# Patient Record
Sex: Female | Born: 1970 | Race: Black or African American | Hispanic: No | Marital: Single | Smoking: Never smoker
Health system: Southern US, Community
[De-identification: ages and names within clinical notes are randomized; demographics above are authoritative.]

## PROBLEM LIST (undated history)

## (undated) DIAGNOSIS — N939 Abnormal uterine and vaginal bleeding, unspecified: Secondary | ICD-10-CM

## (undated) DIAGNOSIS — M1990 Unspecified osteoarthritis, unspecified site: Secondary | ICD-10-CM

## (undated) HISTORY — DX: Abnormal uterine and vaginal bleeding, unspecified: N93.9

---

## 1998-12-19 ENCOUNTER — Other Ambulatory Visit: Admission: RE | Admit: 1998-12-19 | Discharge: 1998-12-19 | Payer: Self-pay | Admitting: *Deleted

## 1999-01-23 ENCOUNTER — Other Ambulatory Visit: Admission: RE | Admit: 1999-01-23 | Discharge: 1999-01-23 | Payer: Self-pay | Admitting: Obstetrics and Gynecology

## 1999-01-23 ENCOUNTER — Encounter (INDEPENDENT_AMBULATORY_CARE_PROVIDER_SITE_OTHER): Payer: Self-pay | Admitting: Specialist

## 1999-06-12 ENCOUNTER — Other Ambulatory Visit: Admission: RE | Admit: 1999-06-12 | Discharge: 1999-06-12 | Payer: Self-pay | Admitting: Obstetrics and Gynecology

## 2000-03-01 ENCOUNTER — Other Ambulatory Visit: Admission: RE | Admit: 2000-03-01 | Discharge: 2000-03-01 | Payer: Self-pay | Admitting: *Deleted

## 2000-05-03 ENCOUNTER — Ambulatory Visit (HOSPITAL_COMMUNITY): Admission: RE | Admit: 2000-05-03 | Discharge: 2000-05-03 | Payer: Self-pay

## 2001-03-05 ENCOUNTER — Other Ambulatory Visit: Admission: RE | Admit: 2001-03-05 | Discharge: 2001-03-05 | Payer: Self-pay | Admitting: Obstetrics and Gynecology

## 2004-02-08 ENCOUNTER — Other Ambulatory Visit: Admission: RE | Admit: 2004-02-08 | Discharge: 2004-02-08 | Payer: Self-pay | Admitting: Obstetrics and Gynecology

## 2005-05-25 ENCOUNTER — Other Ambulatory Visit: Admission: RE | Admit: 2005-05-25 | Discharge: 2005-05-25 | Payer: Self-pay | Admitting: Obstetrics and Gynecology

## 2006-07-09 ENCOUNTER — Other Ambulatory Visit: Admission: RE | Admit: 2006-07-09 | Discharge: 2006-07-09 | Payer: Self-pay | Admitting: Obstetrics & Gynecology

## 2008-02-20 ENCOUNTER — Other Ambulatory Visit: Admission: RE | Admit: 2008-02-20 | Discharge: 2008-02-20 | Payer: Self-pay | Admitting: Obstetrics and Gynecology

## 2008-06-15 ENCOUNTER — Encounter: Admission: RE | Admit: 2008-06-15 | Discharge: 2008-06-15 | Payer: Self-pay | Admitting: Endocrinology

## 2008-08-23 ENCOUNTER — Ambulatory Visit (HOSPITAL_COMMUNITY): Admission: RE | Admit: 2008-08-23 | Discharge: 2008-08-23 | Payer: Self-pay | Admitting: Obstetrics & Gynecology

## 2008-08-23 ENCOUNTER — Encounter: Payer: Self-pay | Admitting: Obstetrics & Gynecology

## 2008-08-23 HISTORY — PX: DILATION AND CURETTAGE OF UTERUS: SHX78

## 2009-08-05 ENCOUNTER — Encounter: Admission: RE | Admit: 2009-08-05 | Discharge: 2009-08-05 | Payer: Self-pay | Admitting: Internal Medicine

## 2010-05-08 ENCOUNTER — Other Ambulatory Visit: Admission: RE | Admit: 2010-05-08 | Discharge: 2010-05-08 | Payer: Self-pay | Admitting: Obstetrics and Gynecology

## 2010-11-13 LAB — URINALYSIS, ROUTINE W REFLEX MICROSCOPIC
Glucose, UA: NEGATIVE mg/dL
Ketones, ur: NEGATIVE mg/dL
Leukocytes, UA: NEGATIVE
Protein, ur: NEGATIVE mg/dL
Urobilinogen, UA: 1 mg/dL (ref 0.0–1.0)
pH: 6 (ref 5.0–8.0)

## 2010-11-13 LAB — URINE MICROSCOPIC-ADD ON

## 2010-11-13 LAB — CBC
Hemoglobin: 11.1 g/dL — ABNORMAL LOW (ref 12.0–15.0)
MCHC: 30.9 g/dL (ref 30.0–36.0)
WBC: 13.8 10*3/uL — ABNORMAL HIGH (ref 4.0–10.5)

## 2010-12-12 NOTE — Op Note (Signed)
NAME:  Aimee Moore, Aimee Moore            ACCOUNT NO.:  [id number]   MEDICAL RECORD NO.:  [id number]          PATIENT TYPE:  AMB   LOCATION:  SDC                           FACILITY:  [facility]   PHYSICIAN:  M. Leda Quail, MD  DATE OF BIRTH:  12-16-1970   DATE OF PROCEDURE:  DATE OF DISCHARGE:                               OPERATIVE REPORT   PREOPERATIVE DIAGNOSES:  62. A 40 year old, G0, morbidly obese African American female with      dysfunctional uterine bleeding.  2. Endometrial biopsy in the office showed slight cytologic atypia,      but not enough tissue for diagnosis.   POSTOPERATIVE DIAGNOSES:  17. A 40 year old, G0, morbidly obese African American female with      dysfunctional uterine bleeding.  2. Endometrial biopsy in the office showed slight cytologic atypia,      but not enough tissue for diagnosis.   PROCEDURE:  D & C.   SURGEON:  M. Leda Quail, MD   ASSISTANT:  OR staff.   ANESTHESIA:  LMA, Dr. Malen Gauze oversaw the case.   FINDINGS:  Relatively small amount of endometrial tissue noted with  curetting.   SPECIMENS:  Endometrial curettings sent to pathology.   ESTIMATED BLOOD LOSS:  Minimal.   FLUIDS:  500 mL of LR.   URINE OUTPUT:  50 mL drained with an in-and-out catheterization at the  beginning of the procedure.   COMPLICATIONS:  None.   INDICATIONS:  Aimee Moore is a 40 year old African American female who  has a history of dysfunctional uterine bleeding which has been managed  initially this year with progestin therapy.  She is not sexually active  and is not at risk for pregnancy.  This did not control her bleeding and  she came in for an ultrasound which showed endometrium of about 11 mm.  Because of her obesity, I decided to do an office biopsy at the same  time.  She was bleeding on the day of her biopsy.  The biopsy results  showed some questionable cytologic atypia.  The pathologist who read it  personally called and suggested a repeat biopsy.   Because of her  obesity, I thought a D and C might be not only diagnostic but  therapeutic, and therefore I recommended proceeding with a D and C.  Risks and benefits were explained to the patient.  She presented for the  procedure today.   The patient was taken to the operating room.  She was placed in supine  position.  LMA anesthesia was administered by Anesthesia staff without  difficulty.  Legs are positioned in the [place] stirrups in the high  lithotomy position.  Perineum, inner thighs, and vagina were prepped in  a normal sterile fashion.  A straight red rubber catheter was used to  drain the bladder of all urine.  The patient was draped in normal  sterile fashion.   Attention was turned toward the vagina.  A long Peterson speculum was  placed in the vagina.  The anterior lip of the cervix was grasped with a  single-tooth tenaculum.  A paracervical block of 1%  lidocaine mixed 1:1  with epinephrine (1:100,000 units) was instilled in the cervix.  Total  10 mL were instilled.  The uterus sounded to 8 cm.  The Pratt dilators  were used to dilate the cervix up to #22.  Another #1 smooth curette was  then used to curette the endometrial cavity until a rough gritty texture  was noted in all quadrants.  A #1 rough curette was used to follow the  smooth curette and again a rough gritty texture was noted in all  quadrants.  There was not a tremendous amount of tissue that was  obtained and actually small amount than I was expecting.  At this point,  the specimen was handed off.  The tenaculum was removed from the cervix.  There was no bleeding from tenaculum sites.  Speculum was removed from  the vagina.  The Betadine prep was cleansed off the patient's skin.  Her  legs were positioned back in the supine position.  She was awakened from  anesthesia.  Sponge, lap, needle, and instrument counts were correct x2.  Once the patient was awake, she was taken to the recovery room in stable   condition.      Aimee Keas, MD  Electronically Signed     MSM/MEDQ  D:  08/23/2008  T:  08/23/2008  Job:  [id number]

## 2011-10-17 ENCOUNTER — Emergency Department (HOSPITAL_COMMUNITY): Payer: 59

## 2011-10-17 ENCOUNTER — Other Ambulatory Visit: Payer: Self-pay

## 2011-10-17 ENCOUNTER — Encounter (HOSPITAL_COMMUNITY): Payer: Self-pay | Admitting: *Deleted

## 2011-10-17 ENCOUNTER — Observation Stay (HOSPITAL_COMMUNITY)
Admission: EM | Admit: 2011-10-17 | Discharge: 2011-10-19 | Disposition: A | Discharge status: Home / Self Care | Payer: 59 | Source: Ambulatory Visit | Attending: Internal Medicine | Admitting: Internal Medicine

## 2011-10-17 DIAGNOSIS — R410 Disorientation, unspecified: Secondary | ICD-10-CM

## 2011-10-17 DIAGNOSIS — Z79899 Other long term (current) drug therapy: Secondary | ICD-10-CM | POA: Insufficient documentation

## 2011-10-17 DIAGNOSIS — R1110 Vomiting, unspecified: Secondary | ICD-10-CM | POA: Insufficient documentation

## 2011-10-17 DIAGNOSIS — E118 Type 2 diabetes mellitus with unspecified complications: Secondary | ICD-10-CM

## 2011-10-17 DIAGNOSIS — E119 Type 2 diabetes mellitus without complications: Secondary | ICD-10-CM | POA: Insufficient documentation

## 2011-10-17 DIAGNOSIS — R4182 Altered mental status, unspecified: Principal | ICD-10-CM | POA: Insufficient documentation

## 2011-10-17 DIAGNOSIS — E669 Obesity, unspecified: Secondary | ICD-10-CM

## 2011-10-17 DIAGNOSIS — N390 Urinary tract infection, site not specified: Secondary | ICD-10-CM | POA: Insufficient documentation

## 2011-10-17 DIAGNOSIS — Z6841 Body Mass Index (BMI) 40.0 and over, adult: Secondary | ICD-10-CM | POA: Insufficient documentation

## 2011-10-17 LAB — BASIC METABOLIC PANEL
Chloride: 101 mEq/L (ref 96–112)
Creatinine, Ser: 0.61 mg/dL (ref 0.50–1.10)
GFR calc Af Amer: >90 mL/min (ref >90)
Potassium: 4.1 mEq/L (ref 3.5–5.1)
Sodium: 137 mEq/L (ref 135–145)

## 2011-10-17 LAB — URINALYSIS, ROUTINE W REFLEX MICROSCOPIC
Glucose, UA: NEGATIVE mg/dL
Protein, ur: NEGATIVE mg/dL
pH: 5.5 (ref 5.0–8.0)

## 2011-10-17 LAB — CBC
Hemoglobin: 14.3 g/dL (ref 12.0–15.0)
MCHC: 33.7 g/dL (ref 30.0–36.0)
Platelets: 396 10*3/uL (ref 150–400)
RBC: 4.79 MIL/uL (ref 3.87–5.11)

## 2011-10-17 LAB — CARDIAC PANEL(CRET KIN+CKTOT+MB+TROPI)
CK, MB: 0.8 ng/mL (ref 0.3–4.0)
Relative Index: INVALID (ref 0.0–2.5)
Total CK: 42 U/L (ref 7–177)
Troponin I: <0.3 ng/mL (ref <0.30)

## 2011-10-17 LAB — RAPID URINE DRUG SCREEN, HOSP PERFORMED
Barbiturates: NOT DETECTED
Benzodiazepines: NOT DETECTED
Opiates: NOT DETECTED
Tetrahydrocannabinol: NOT DETECTED

## 2011-10-17 LAB — SALICYLATE LEVEL: Salicylate Lvl: <2 mg/dL — ABNORMAL LOW (ref 2.8–20.0)

## 2011-10-17 LAB — AMMONIA: Ammonia: 23 umol/L (ref 11–60)

## 2011-10-17 LAB — LACTIC ACID, PLASMA: Lactic Acid, Venous: 1 mmol/L (ref 0.5–2.2)

## 2011-10-17 LAB — URINE MICROSCOPIC-ADD ON

## 2011-10-17 MED ORDER — SODIUM CHLORIDE 0.9 % IV BOLUS (SEPSIS): 500.0000 mL | INTRAVENOUS | Status: DC

## 2011-10-17 MED ORDER — ZIPRASIDONE MESYLATE 20 MG IM SOLR
20.0000 mg | Freq: Once | INTRAMUSCULAR | Status: AC
  Administered 2011-10-17: 20 mg via INTRAMUSCULAR

## 2011-10-17 MED ORDER — SODIUM CHLORIDE 0.9 % IV SOLN: 250.0000 mL | INTRAVENOUS | Status: DC | PRN

## 2011-10-17 MED ORDER — SODIUM CHLORIDE 0.9 % IJ SOLN: 3.0000 mL | INTRAMUSCULAR | Status: DC | PRN

## 2011-10-17 MED ORDER — SODIUM CHLORIDE 0.9 % IJ SOLN
3.0000 mL | Freq: Two times a day (BID) | INTRAMUSCULAR | Status: DC
  Administered 2011-10-18 – 2011-10-19 (×2): 3 mL via INTRAVENOUS

## 2011-10-17 MED ORDER — ZIPRASIDONE MESYLATE 20 MG IM SOLR
INTRAMUSCULAR | Status: AC
  Filled 2011-10-17: qty 20

## 2011-10-17 NOTE — ED Notes (Signed)
Attempted to call report to [phone], RN busy and will call me back

## 2011-10-17 NOTE — ED Notes (Signed)
SW-transport tech- is taking pt to CT.

## 2011-10-17 NOTE — Consult Note (Signed)
[facility] CONSULT NOTE     Reason for Consult: AMS    HPI:    Aimee Moore is an 41 y.o. female who was last seen normal yesterday at about 4 AM.  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  Walked into the apartment and sat down but was not very verbal.  Answred yes and no to questions.  She went to lay down but came back to living room 10 minutes later.  At this time she was just staring off and would not answer questions.  Next AM her boyfriend noted she was not getting ready for work and remained non verbal.  It was at this time EMS was called.  Currently she is in stretcher, looking around quickly, will follow selective verbal commands.  She is moving all extremities purposefully and spontaneously.   Past Medical History  Diagnosis Date  . Diabetes mellitus     History reviewed. No pertinent past surgical history.  History reviewed. No pertinent family history.  Social History:  reports that she has never smoked. She does not have any smokeless tobacco history on file. She reports that she does not drink alcohol or use illicit drugs.  No Known Allergies  Medications:    Prior to Admission:  Metformin   Scheduled:   . sodium chloride  500 mL Intravenous STAT    Review of Systems - unattainable  Blood pressure 138/89, pulse 107, temperature 97.6 F (36.4 C), temperature source Oral, resp. rate 15, SpO2 95.00%.   Neurologic Examination:   Mental Status: Alert,  looking around quickly, will follow selective verbal commands. Cranial Nerves: II-Visual fields grossly intact. III/IV/VI-Extraocular movements intact.  Pupils reactive bilaterally. V/VII-Smile symmetric VIII-grossly intact IX/X-normal gag XI-bilateral shoulder shrug XII-midline tongue extension Motor: Withdrawals briskly all four extremities antigravity Sensory: Pinprick and light touch intact  throughout, bilaterally Deep Tendon Reflexes: 1 and symmetric throughout Plantars: Downgoing bilaterally Cerebellar: no dysmetria noted on UE movements  No results found for this basename: cbc, bmp, coags, chol, tri, ldl, hga1c    Results for orders placed during the hospital encounter of 10/17/11 (from the past 48 hour(s))  CBC     Status: Normal   Collection Time   10/17/11  1:06 PM      Component Value Range Comment   WBC 10.5  4.0 - 10.5 (K/uL)    RBC 4.79  3.87 - 5.11 (MIL/uL)    Hemoglobin 14.3  12.0 - 15.0 (g/dL)    HCT 95.2  84.1 - 32.4 (%)    MCV 88.5  78.0 - 100.0 (fL)    MCH 29.9  26.0 - 34.0 (pg)    MCHC 33.7  30.0 - 36.0 (g/dL)    RDW 40.1  02.7 - 25.3 (%)    Platelets 396  150 - 400 (K/uL)   BASIC METABOLIC PANEL     Status: Abnormal   Collection Time   10/17/11  1:06 PM      Component Value Range Comment   Sodium 137  135 - 145 (mEq/L)    Potassium 4.1  3.5 - 5.1 (mEq/L)    Chloride 101  96 - 112 (mEq/L)    CO2 24  19 - 32 (mEq/L)    Glucose, Bld 146 (*) 70 - 99 (mg/dL)    BUN 8  6 -  23 (mg/dL)    Creatinine, Ser 1.61  0.50 - 1.10 (mg/dL)    Calcium 9.5  8.4 - 10.5 (mg/dL)    GFR calc non Af Amer >90  >90 (mL/min)    GFR calc Af Amer >90  >90 (mL/min)   CARDIAC PANEL(CRET KIN+CKTOT+MB+TROPI)     Status: Normal   Collection Time   10/17/11  1:06 PM      Component Value Range Comment   Total CK 42  7 - 177 (U/L)    CK, MB 0.8  0.3 - 4.0 (ng/mL)    Troponin I <0.30  <0.30 (ng/mL)    Relative Index RELATIVE INDEX IS INVALID  0.0 - 2.5    ACETAMINOPHEN LEVEL     Status: Normal   Collection Time   10/17/11  1:41 PM      Component Value Range Comment   Acetaminophen (Tylenol), Serum <15.0  10 - 30 (ug/mL)   SALICYLATE LEVEL     Status: Abnormal   Collection Time   10/17/11  1:41 PM      Component Value Range Comment   Salicylate Lvl <2.0 (*) 2.8 - 20.0 (mg/dL)     Dg Chest 2 View  0/96/0454  *RADIOLOGY REPORT*  Clinical Data: Shortness of breath,  confusion, history diabetes  CHEST - 2 VIEW  Comparison: 08/05/2009  Findings: Lateral view nondiagnostic due to body habitus and light technique. Enlargement of cardiac silhouette. Pulmonary vascular congestion. Minimally tortuous thoracic aorta. Decreased lung volumes with minimal atelectasis at mid-to-lower right lung. No gross infiltrate or effusion.  IMPRESSION: Enlargement of cardiac silhouette with pulmonary vascular congestion. Decreased lung volumes with minimal atelectasis right lung as above.  Original Report Authenticated By: Lollie Marrow, M.D.   Ct Head Wo Contrast  10/17/2011  *RADIOLOGY REPORT*  Clinical Data: Weakness and speech disturbance.  CT HEAD WITHOUT CONTRAST  Technique:  Contiguous axial images were obtained from the base of the skull through the vertex without contrast.  Comparison: None.  Findings: The examination suffers from some motion degradation. The brain does not show atrophy.  There is no evidence of old or acute infarction, mass lesion, hemorrhage, hydrocephalus or extra- axial collection.  Calvarium is unremarkable.  Sinuses, middle ears and mastoids are clear.  The sella appears enlarged, possibly due to arachnoid herniation.  No evidence of sellar or suprasellar mass.  IMPRESSION: Negative head CT.  Original Report Authenticated By: Thomasenia Sales, M.D.     Assessment/Plan:    41 YO female with AMS for 24 hours.  Afebrile  CBC/BMET WNL and CT head negative.  No known history of drug abuse.  No concerning prescribed medications.  Etiology of AMS unknown at this time.    Recommend: 1) EEG 2) MRI brain 3) UDS  Felicie Morn PA-C [facility] [phone]  10/17/2011, 3:11 PM   Patient seen and examined. I agree with the above.  Thana Farr, MD [facility] [phone]  10/17/2011  4:51 PM

## 2011-10-17 NOTE — ED Notes (Signed)
CBG CHECKED BY EMT R [id number]

## 2011-10-17 NOTE — ED Notes (Signed)
5530-01 Ready 

## 2011-10-17 NOTE — ED Notes (Signed)
EKG to be done once pt. Is pulled into a room. In addition, pt unable to provide urine at this time due to confusion.

## 2011-10-17 NOTE — ED Notes (Signed)
5501-02 Ready 

## 2011-10-17 NOTE — H&P (Signed)
Aimee Moore,Aimee Moore Female, 41 y.o., 1971/06/24 10/17/11  PCP:  Andi Devon  Chief Complaint:  Patient is non verbal  HPI: 41 y/o AAF that presented to the hospital after being found non verbal by her boyfriend.  He has lived with her for years.  Does not report any history of psychiatric disease.  In the ED work up was negative.  Neuro was called and they requested further work-up with MRI, UDS, and EEG to rule out any non psychiatric cause.  Orders have been placed and we were asked to admit patient.  Patient looked in my direction and answered (with a nod) yes and no to only a few questions.  Otherwise did not interact with examiner well.  Allergies:  No Known Allergies    Past Medical History  Diagnosis Date  . Diabetes mellitus     History reviewed. No pertinent past surgical history.  Prior to Admission medications   Medication Sig Start Date End Date Taking? Authorizing Provider  norethindrone (AYGESTIN) 5 MG tablet Take 5 mg by mouth daily.   Yes Historical Provider, MD  Saxagliptin-Metformin 2.11-998 MG TB24 Take 1 tablet by mouth daily.   Yes Historical Provider, MD  Vitamin D, Ergocalciferol, (DRISDOL) 50000 UNITS CAPS Take 50,000 Units by mouth 2 (two) times a week. Takes on Mondays & Thursdays   Yes Historical Provider, MD    Social History:  reports that she has never smoked. She does not have any smokeless tobacco history on file. She reports that she does not drink alcohol or use illicit drugs.  History reviewed. No pertinent family history.  Review of Systems:  Unable to obtain due to patient current condition.   Physical Exam: Blood pressure 138/89, pulse 107, temperature 97.6 F (36.4 C), temperature source Oral, resp. rate 15, SpO2 95.00%. General: in no acute distress, gazes towards examiner. HEENT: EOMI, no goiter. Heart: Regular rate and rhythm, without murmurs, rubs, gallops. Lungs: Clear to auscultation bilaterally. Abdomen: Soft, nontender,  nondistended, positive bowel sounds, large panus Extremities: No clubbing cyanosis or edema with positive pedal pulses. Neuro: limited due to patient lack of interaction with examiner.  Moves all extremities and gazes towards examiner.  With a nod answered some questions (Are you in the hospital)    Labs on Admission:  Results for orders placed during the hospital encounter of 10/17/11 (from the past 48 hour(s))  CBC     Status: Normal   Collection Time   10/17/11  1:06 PM      Component Value Range Comment   WBC 10.5  4.0 - 10.5 (K/uL)    RBC 4.79  3.87 - 5.11 (MIL/uL)    Hemoglobin 14.3  12.0 - 15.0 (g/dL)    HCT 16.1  09.6 - 04.5 (%)    MCV 88.5  78.0 - 100.0 (fL)    MCH 29.9  26.0 - 34.0 (pg)    MCHC 33.7  30.0 - 36.0 (g/dL)    RDW 40.9  81.1 - 91.4 (%)    Platelets 396  150 - 400 (K/uL)   BASIC METABOLIC PANEL     Status: Abnormal   Collection Time   10/17/11  1:06 PM      Component Value Range Comment   Sodium 137  135 - 145 (mEq/L)    Potassium 4.1  3.5 - 5.1 (mEq/L)    Chloride 101  96 - 112 (mEq/L)    CO2 24  19 - 32 (mEq/L)    Glucose, Bld 146 (*) 70 -  99 (mg/dL)    BUN 8  6 - 23 (mg/dL)    Creatinine, Ser 9.60  0.50 - 1.10 (mg/dL)    Calcium 9.5  8.4 - 10.5 (mg/dL)    GFR calc non Af Amer >90  >90 (mL/min)    GFR calc Af Amer >90  >90 (mL/min)   CARDIAC PANEL(CRET KIN+CKTOT+MB+TROPI)     Status: Normal   Collection Time   10/17/11  1:06 PM      Component Value Range Comment   Total CK 42  7 - 177 (U/L)    CK, MB 0.8  0.3 - 4.0 (ng/mL)    Troponin I <0.30  <0.30 (ng/mL)    Relative Index RELATIVE INDEX IS INVALID  0.0 - 2.5    ACETAMINOPHEN LEVEL     Status: Normal   Collection Time   10/17/11  1:41 PM      Component Value Range Comment   Acetaminophen (Tylenol), Serum <15.0  10 - 30 (ug/mL)   SALICYLATE LEVEL     Status: Abnormal   Collection Time   10/17/11  1:41 PM      Component Value Range Comment   Salicylate Lvl <2.0 (*) 2.8 - 20.0 (mg/dL)   GLUCOSE,  CAPILLARY     Status: Abnormal   Collection Time   10/17/11  3:39 PM      Component Value Range Comment   Glucose-Capillary 128 (*) 70 - 99 (mg/dL)     Radiological Exams on Admission: Dg Chest 2 View  10/17/2011  *RADIOLOGY REPORT*  Clinical Data: Shortness of breath, confusion, history diabetes  CHEST - 2 VIEW  Comparison: 08/05/2009  Findings: Lateral view nondiagnostic due to body habitus and light technique. Enlargement of cardiac silhouette. Pulmonary vascular congestion. Minimally tortuous thoracic aorta. Decreased lung volumes with minimal atelectasis at mid-to-lower right lung. No gross infiltrate or effusion.  IMPRESSION: Enlargement of cardiac silhouette with pulmonary vascular congestion. Decreased lung volumes with minimal atelectasis right lung as above.  Original Report Authenticated By: Lollie Marrow, M.D.   Ct Head Wo Contrast  10/17/2011  *RADIOLOGY REPORT*  Clinical Data: Weakness and speech disturbance.  CT HEAD WITHOUT CONTRAST  Technique:  Contiguous axial images were obtained from the base of the skull through the vertex without contrast.  Comparison: None.  Findings: The examination suffers from some motion degradation. The brain does not show atrophy.  There is no evidence of old or acute infarction, mass lesion, hemorrhage, hydrocephalus or extra- axial collection.  Calvarium is unremarkable.  Sinuses, middle ears and mastoids are clear.  The sella appears enlarged, possibly due to arachnoid herniation.  No evidence of sellar or suprasellar mass.  IMPRESSION: Negative head CT.  Original Report Authenticated By: Thomasenia Sales, M.D.    Assessment/Plan 1) Altered mental status: Question is whether this is pscychiatric or not.  Based on neurology's note we will follow up with their recommendations.  This may be a conversion disorder of which patient may benefit from a psychiatric evaluation.  2) DM: Place on diabetic diet, check cbg's, and place on SSI.  Time Spent on  Admission: > 40 minutes.  Penny Pia [facility] Pager: [phone] 10/17/2011, 4:57 PM

## 2011-10-17 NOTE — ED Provider Notes (Addendum)
History     CSN: [id number]  Arrival date & time 10/17/11  1229   First MD Initiated Contact with Patient 10/17/11 1314      Chief Complaint  Patient presents with  . Weakness  41yo F history of DM, nonverbal at this time.  Most history obtained from pt's friend/roommate.    Was in normal state of health up until 2 weeks ago.  Vomited.   Dr Kirtland Bouchard. [address]   Lightheaded.  Saw her PMD, not sure what was done.  Went to work yesterday, was less Interactive last nights.    No further history is obtained.  Pt is non verbal and uncooperative. .   (Consider location/radiation/quality/duration/timing/severity/associated sxs/prior treatment) HPI  Past Medical History  Diagnosis Date  . Diabetes mellitus     History reviewed. No pertinent past surgical history.  History reviewed. No pertinent family history.  History  Substance Use Topics  . Smoking status: Never Smoker   . Smokeless tobacco: Not on file  . Alcohol Use: No    OB History    Grav Para Term Preterm Abortions TAB SAB Ect Mult Living                  Review of Systems  All other systems reviewed and are negative.    Allergies  Review of patient's allergies indicates no known allergies.  Home Medications   Current Outpatient Rx  Name Route Sig Dispense Refill  . NORETHINDRONE ACETATE 5 MG PO TABS Oral Take 5 mg by mouth daily.    Marland Kitchen SAXAGLIPTIN-METFORMIN ER 2.11-998 MG PO TB24 Oral Take 1 tablet by mouth daily.    Marland Kitchen VITAMIN D (ERGOCALCIFEROL) 50000 UNITS PO CAPS Oral Take 50,000 Units by mouth 2 (two) times a week. Takes on Mondays & Thursdays      BP 138/89  Pulse 107  Temp(Src) 97.6 F (36.4 C) (Oral)  Resp 15  SpO2 95%  Physical Exam  Nursing note and vitals reviewed. Constitutional: She is oriented to person, place, and time. She appears well-developed and well-nourished.  HENT:  Head: Normocephalic and atraumatic.  Eyes: Conjunctivae and EOM are normal. Pupils are equal,  round, and reactive to light.  Neck: Neck supple.  Cardiovascular: Normal rate and regular rhythm.  Exam reveals no gallop and no friction rub.   No murmur heard. Pulmonary/Chest: Breath sounds normal. She has no wheezes. She has no rales. She exhibits no tenderness.  Abdominal: Soft. Bowel sounds are normal. She exhibits no distension. There is no tenderness. There is no rebound and no guarding.  Musculoskeletal: Normal range of motion.  Neurological: She is alert and oriented to person, place, and time. No cranial nerve deficit. Coordination normal.       Altert, non cooperative, nods, moves all 4 extremities, reflexes 1-2 +   Skin: Skin is warm and dry. No rash noted.  Psychiatric: She has a normal mood and affect.    ED Course  Procedures (including critical care time)   Labs Reviewed  CBC  BASIC METABOLIC PANEL  URINALYSIS, ROUTINE W REFLEX MICROSCOPIC  DRUGS OF ABUSE SCREEN W ALC, ROUTINE URINE  CARDIAC PANEL(CRET KIN+CKTOT+MB+TROPI)   No results found.   No diagnosis found.    MDM  Pt is seen and examined;  Initial history and physical completed.  Will follow.   Seen and evaluated with medical student. Generalized weakness for past 2 days. No history of diabetes, and obesity. Lightheaded, fairly non cooperative with exam.  Labs,  head  CT, urine.  Will follow closely.      Initial studies have been reviewed. Cardiac markers normal, Tylenol, salicylate negative. Electrolyte panel is normal. Blood glucose is 146. White count is normal.  I have briefly discussed this with neurology and will recommend will request them to consult. Initial chest x-ray shows enlargement of cardiac silhouette with some pulmonary vascular congestion.   CT of the head initially was negative.   Will follow recommendations from neurology. May need medicine admission for altered mental status.  Neurology request MRI.  Pt now more UN cooperative. Checking blood sugar.  Initial VSS.    Results for orders placed during the hospital encounter of 10/17/11  CBC      Component Value Range   WBC 10.5  4.0 - 10.5 (K/uL)   RBC 4.79  3.87 - 5.11 (MIL/uL)   Hemoglobin 14.3  12.0 - 15.0 (g/dL)   HCT 14.7  82.9 - 56.2 (%)   MCV 88.5  78.0 - 100.0 (fL)   MCH 29.9  26.0 - 34.0 (pg)   MCHC 33.7  30.0 - 36.0 (g/dL)   RDW 13.0  86.5 - 78.4 (%)   Platelets 396  150 - 400 (K/uL)  BASIC METABOLIC PANEL      Component Value Range   Sodium 137  135 - 145 (mEq/L)   Potassium 4.1  3.5 - 5.1 (mEq/L)   Chloride 101  96 - 112 (mEq/L)   CO2 24  19 - 32 (mEq/L)   Glucose, Bld 146 (*) 70 - 99 (mg/dL)   BUN 8  6 - 23 (mg/dL)   Creatinine, Ser 6.96  0.50 - 1.10 (mg/dL)   Calcium 9.5  8.4 - 29.5 (mg/dL)   GFR calc non Af Amer >90  >90 (mL/min)   GFR calc Af Amer >90  >90 (mL/min)  CARDIAC PANEL(CRET KIN+CKTOT+MB+TROPI)      Component Value Range   Total CK 42  7 - 177 (U/L)   CK, MB 0.8  0.3 - 4.0 (ng/mL)   Troponin I <0.30  <0.30 (ng/mL)   Relative Index RELATIVE INDEX IS INVALID  0.0 - 2.5   ACETAMINOPHEN LEVEL      Component Value Range   Acetaminophen (Tylenol), Serum <15.0  10 - 30 (ug/mL)  SALICYLATE LEVEL      Component Value Range   Salicylate Lvl <2.0 (*) 2.8 - 20.0 (mg/dL)   Dg Chest 2 View  2/84/1324  *RADIOLOGY REPORT*  Clinical Data: Shortness of breath, confusion, history diabetes  CHEST - 2 VIEW  Comparison: 08/05/2009  Findings: Lateral view nondiagnostic due to body habitus and light technique. Enlargement of cardiac silhouette. Pulmonary vascular congestion. Minimally tortuous thoracic aorta. Decreased lung volumes with minimal atelectasis at mid-to-lower right lung. No gross infiltrate or effusion.  IMPRESSION: Enlargement of cardiac silhouette with pulmonary vascular congestion. Decreased lung volumes with minimal atelectasis right lung as above.  Original Report Authenticated By: Lollie Marrow, M.D.   Ct Head Wo Contrast  10/17/2011  *RADIOLOGY REPORT*  Clinical  Data: Weakness and speech disturbance.  CT HEAD WITHOUT CONTRAST  Technique:  Contiguous axial images were obtained from the base of the skull through the vertex without contrast.  Comparison: None.  Findings: The examination suffers from some motion degradation. The brain does not show atrophy.  There is no evidence of old or acute infarction, mass lesion, hemorrhage, hydrocephalus or extra- axial collection.  Calvarium is unremarkable.  Sinuses, middle ears and mastoids are clear.  The  sella appears enlarged, possibly due to arachnoid herniation.  No evidence of sellar or suprasellar mass.  IMPRESSION: Negative head CT.  Original Report Authenticated By: Thomasenia Sales, M.D.        Sherlie Boyum A. Patrica Duel, MD 10/17/11 1536   Neurologic evaluation completed by Dr. Thad Ranger, the neurologist. They are also recommending a medical admission to facilitate further evaluation of the patient's altered mental status and behavior. Although she was initially nonverbal. Now. The patient is putting out certain things like, "you better watch her attitude."  She also told the nurse, "you better get that F. away from me'     Still awaiting urine tests. The initial lab studies showed no significant cause or etiology for her behavioral and mental status changes.   [facility] has been paged and requesting medical evaluation for possible admission. May also need Telepsych. Have ordered Geodon to facilitate sedation. Patient's vital signs have been normal.  Theron Arista A. Patrica Duel, MD 10/17/11 1545    Date: 10/17/2011  Rate: 107  Rhythm: sinus tachycardia  QRS Axis: normal  Intervals: normal  ST/T Wave abnormalities: nonspecific ST/T changes  Conduction Disutrbances:none  Narrative Interpretation:   Old EKG Reviewed: none available    Amoni Scallan A. Patrica Duel, MD 10/17/11 1557

## 2011-10-17 NOTE — ED Notes (Signed)
Patient presents to ed c/o weakness and unable to get her words out appropriately x 2 days. Patient is alert opens her eyes, however eyes are fluttering the whole time she is attempting to talk. Denies pain. Moves all ext. Bilateral grips strong and equal.

## 2011-10-17 NOTE — ED Notes (Signed)
C/o weakness  Onset 2 days ago unable to verbalized

## 2011-10-17 NOTE — ED Notes (Signed)
Have attempted to reposition for comfort, pt unwilling to assist or tell staff how she would be more comfortable. Was repositioned in bed and given extra pillows.

## 2011-10-17 NOTE — Plan of Care (Signed)
Problem: Phase I Progression Outcomes Goal: Pain controlled with appropriate interventions Outcome: Completed/Met Date Met:  10/17/11 Patient has no pain

## 2011-10-18 ENCOUNTER — Inpatient Hospital Stay (HOSPITAL_COMMUNITY): Payer: 59

## 2011-10-18 DIAGNOSIS — R410 Disorientation, unspecified: Secondary | ICD-10-CM | POA: Diagnosis present

## 2011-10-18 LAB — GLUCOSE, CAPILLARY

## 2011-10-18 LAB — URINE DRUGS OF ABUSE SCREEN W ALC, ROUTINE (REF LAB)
Amphetamine Screen, Ur: NEGATIVE
Barbiturate Quant, Ur: NEGATIVE
Ethyl Alcohol: <10 mg/dL (ref <10)
Methadone: NEGATIVE
Propoxyphene: NEGATIVE

## 2011-10-18 LAB — CBC
HCT: 41.3 % (ref 36.0–46.0)
MCH: 29.6 pg (ref 26.0–34.0)
MCV: 89.2 fL (ref 78.0–100.0)
RDW: 13 % (ref 11.5–15.5)
WBC: 11.2 10*3/uL — ABNORMAL HIGH (ref 4.0–10.5)

## 2011-10-18 MED ORDER — NORETHINDRONE ACETATE 5 MG PO TABS: 5.0000 mg | ORAL_TABLET | Freq: Every day | ORAL | Status: DC

## 2011-10-18 MED ORDER — VITAMIN D (ERGOCALCIFEROL) 1.25 MG (50000 UNIT) PO CAPS
50000.0000 [IU] | ORAL_CAPSULE | ORAL | Status: DC
  Administered 2011-10-18: 50000 [IU] via ORAL
  Filled 2011-10-18: qty 1

## 2011-10-18 MED ORDER — LORAZEPAM 2 MG/ML IJ SOLN: 1.0000 mg | INTRAMUSCULAR | Status: DC | PRN

## 2011-10-18 MED ORDER — METFORMIN HCL ER 500 MG PO TB24
1000.0000 mg | ORAL_TABLET | Freq: Every day | ORAL | Status: DC
  Filled 2011-10-18 (×2): qty 2

## 2011-10-18 MED ORDER — SAXAGLIPTIN-METFORMIN ER 2.5-1000 MG PO TB24: 1.0000 | ORAL_TABLET | Freq: Every day | ORAL | Status: DC

## 2011-10-18 MED ORDER — LINAGLIPTIN 5 MG PO TABS
5.0000 mg | ORAL_TABLET | Freq: Every day | ORAL | Status: DC
  Filled 2011-10-18 (×2): qty 1

## 2011-10-18 NOTE — Progress Notes (Signed)
[facility] PROGRESS NOTE    SUBJECTIVE   In room standing in doorway of bathroom. Continues to be confused and will only follow selective commands.  She is speaking but not in full sentences.  MRI was not able to be obtained secondary to weight.   OBJECTIVE   Vital signs in last 24 hours: Temp:  [97.9 F (36.6 C)-98.6 F (37 C)] 98.1 F (36.7 C) (03/21 1407) Pulse Rate:  [92-109] 92  (03/21 1407) Resp:  [18-38] 18  (03/21 1407) BP: (106-157)/(66-107) 130/66 mmHg (03/21 1407) SpO2:  [90 %-100 %] 97 % (03/21 1407) Weight:  [184.1 kg (405 lb 13.9 oz)-186.7 kg (411 lb 9.6 oz)] 186.7 kg (411 lb 9.6 oz) (03/21 0500)  Intake/Output from previous day: 03/20 0701 - 03/21 0700 In: -  Out: 450 [Urine:450] Intake/Output this shift:   Nutritional status: General  Past Medical History  Diagnosis Date  . Diabetes mellitus     Neurologic Exam:  Mental Status:  Alert,standing in bathroom on her own. She will follow commands but only selective commands. Appears anxious.  Cranial Nerves:  II-Visual fields grossly intact.  III/IV/VI-Extraocular movements intact. Pupils reactive bilaterally.  V/VII-Smile symmetric  VIII-grossly intact  IX/X-normal gag  XI-bilateral shoulder shrug  XII-midline tongue extension  Motor: Standing on her own, able to hold her balance, moving all extremities purposefully and spontaneously. Continues to have fine whole body tremor without rigidity.  Sensory: Pinprick and light touch intact throughout, bilaterally  Deep Tendon Reflexes: 1 and symmetric throughout  Plantars: Downgoing bilaterally  Cerebellar: no dysmetria noted on UE movements   Lab Results: No results found for this basename: cbc, bmp, coags, chol, tri, ldl, hga1c   Lipid Panel No results found for this basename: CHOL,TRIG,HDL,CHOLHDL,VLDL,LDLCALC in the last 72 hours  Studies/Results: Dg Chest 2 View  10/17/2011  *RADIOLOGY REPORT*   Clinical Data: Shortness of breath, confusion, history diabetes  CHEST - 2 VIEW  Comparison: 08/05/2009  Findings: Lateral view nondiagnostic due to body habitus and light technique. Enlargement of cardiac silhouette. Pulmonary vascular congestion. Minimally tortuous thoracic aorta. Decreased lung volumes with minimal atelectasis at mid-to-lower right lung. No gross infiltrate or effusion.  IMPRESSION: Enlargement of cardiac silhouette with pulmonary vascular congestion. Decreased lung volumes with minimal atelectasis right lung as above.  Original Report Authenticated By: Lollie Marrow, M.D.   Ct Head Wo Contrast  10/18/2011  *RADIOLOGY REPORT*  Clinical Data: Suspected seizure.  Sudden episode of staring and nonverbal behavior.  CT HEAD WITHOUT CONTRAST  Technique:  Contiguous axial images were obtained from the base of the skull through the vertex without contrast.  Comparison: 10/17/2011.  Findings: There is no evidence for acute infarction, intracranial hemorrhage, mass lesion, hydrocephalus, or extra-axial fluid. There is no atrophy or white matter disease.  Temporal lobes are symmetric.  Calvarium is intact. Empty sella.  Visualized sinuses and mastoids are clear.  There are no orbital findings.  Compared with yesterday's exam, there is no change although the brain is better visualized on today's study because of less motion.  IMPRESSION: Empty sella, otherwise negative exam.  If there is concern for underlying seizure disorder, MRI of the brain without and with contrast on an elective basis could be helpful in further evaluation.  Original Report Authenticated By:  Elsie Stain, M.D.   Ct Head Wo Contrast  10/17/2011  *RADIOLOGY REPORT*  Clinical Data: Weakness and speech disturbance.  CT HEAD WITHOUT CONTRAST  Technique:  Contiguous axial images were obtained from the base of the skull through the vertex without contrast.  Comparison: None.  Findings: The examination suffers from some motion  degradation. The brain does not show atrophy.  There is no evidence of old or acute infarction, mass lesion, hemorrhage, hydrocephalus or extra- axial collection.  Calvarium is unremarkable.  Sinuses, middle ears and mastoids are clear.  The sella appears enlarged, possibly due to arachnoid herniation.  No evidence of sellar or suprasellar mass.  IMPRESSION: Negative head CT.  Original Report Authenticated By: Thomasenia Sales, M.D.    Medications:     Prior to Admission:  Prescriptions prior to admission  Medication Sig Dispense Refill  . norethindrone (AYGESTIN) 5 MG tablet Take 5 mg by mouth daily.      . Saxagliptin-Metformin 2.11-998 MG TB24 Take 1 tablet by mouth daily.      . Vitamin D, Ergocalciferol, (DRISDOL) 50000 UNITS CAPS Take 50,000 Units by mouth 2 (two) times a week. Takes on Mondays & Thursdays       Scheduled:   . metFORMIN  1,000 mg Oral Q breakfast   And  . linagliptin  5 mg Oral Q breakfast  . norethindrone  5 mg Oral Daily  . sodium chloride  3 mL Intravenous Q12H  . Vitamin D (Ergocalciferol)  50,000 Units Oral 2 times weekly  . ziprasidone  20 mg Intramuscular Once  . DISCONTD: Saxagliptin-Metformin  1 tablet Oral Daily  . DISCONTD: sodium chloride  500 mL Intravenous STAT    Assessment/Plan:   41 YO female with AMS unknown etiology.   1) EEG pending 2) UDS negative 3) MRI unable to be obtained due to weight.  Follow up CT head negative.  4) I spoke with Dr. Lavera Guise and a psych consultation has been called in.   Will follow EEG.   Felicie Morn PA-C [facility] [phone]  10/18/2011, 3:39 PM

## 2011-10-18 NOTE — Progress Notes (Signed)
Utilization review completed.  

## 2011-10-18 NOTE — Progress Notes (Signed)
Inpatient Diabetes Program Recommendations  AACE/ADA: New Consensus Statement on Inpatient Glycemic Control (2009)  Target Ranges:  Prepandial:   less than 140 mg/dL      Peak postprandial:   less than 180 mg/dL (1-2 hours)      Critically ill patients:  140 - 180 mg/dL   Reason for Visit: lab blood sugar greater than 180 mg/dl  Inpatient Diabetes Program Recommendations Correction (SSI): Start Novolog MODERATE correction scale TID & HS if CBGs continue greater than 180 mg/dl [id number]: Check [id number] to check home blood glucose control.   Note: Check CBGs AC & HS

## 2011-10-18 NOTE — Progress Notes (Signed)
10/18/11  1600 Sitter with pt. Called security because pt. Still wants to leave;pt. Has no clothes;still states that she brought herself to the hospital-when her boyfriend did. Security came and talked with pt.-pt. States that she's being held hostage.  Leandrew Koyanagi Sheria Rosello,Rn

## 2011-10-18 NOTE — Progress Notes (Signed)
10/18/11  1525  Pt. went into bathroom and stayed for along time; I opened door and pt. Tried to close door and stated that she's going to leave. Told pt. That she can't leave, that Dr. Is running tests on her. Had tech get Dr. Lavera Guise and he talked to pt.-talked with pt. and told her that she can't leave yet-not stable enough to leave. Asked for order for sitter for elopement  Leandrew Koyanagi. Trinette Vera,RN

## 2011-10-18 NOTE — Progress Notes (Signed)
Portable EEG w/ video completed. 

## 2011-10-18 NOTE — Plan of Care (Signed)
Problem: Phase I Progression Outcomes Goal: Initial discharge plan identified Outcome: Progressing Pt. Need involuntary commitment-?[facility].

## 2011-10-18 NOTE — Progress Notes (Signed)
This afternoon I received a referral from Dr. Lavera Guise on the floor. We are assisting the treatment team in developing a plan of care for Aimee Moore who has demonstrated some confusion and irritability while pacing the floor in her room. We will continue to follow for discharge planning needs.  Delphia Grates, MSW, Marine scientist Clinical Social Work [employer]

## 2011-10-18 NOTE — Progress Notes (Signed)
Subjective: Unable to find some words Unable to recall events from yesterday Calm cooperative  Ambulating around the room  Denies hallucinations  Denies suicidal thoughts            Physical Exam: Blood pressure 130/66, pulse 92, temperature 98.1 F (36.7 C), temperature source Oral, resp. rate 18, height 5\' 3"  (1.6 m), weight 186.7 kg (411 lb 9.6 oz), SpO2 97.00%. Alert  CVS: rrr Rs: ctab  Abdomen ; soft , nt    Investigations:  No results found for this or any previous visit (from the past 240 hour(s)).   Basic Metabolic Panel:  Basename 10/17/11 1306  NA 137  K 4.1  CL 101  CO2 24  GLUCOSE 146*  BUN 8  CREATININE 0.61  CALCIUM 9.5  MG --  PHOS --   Liver Function Tests: No results found for this basename: AST:2,ALT:2,ALKPHOS:2,BILITOT:2,PROT:2,ALBUMIN:2 in the last 72 hours   CBC:  Basename 10/18/11 1409 10/17/11 1306  WBC 11.2* 10.5  NEUTROABS -- --  HGB 13.7 14.3  HCT 41.3 42.4  MCV 89.2 88.5  PLT 415* 396    Dg Chest 2 View  10/17/2011  *RADIOLOGY REPORT*  Clinical Data: Shortness of breath, confusion, history diabetes  CHEST - 2 VIEW  Comparison: 08/05/2009  Findings: Lateral view nondiagnostic due to body habitus and light technique. Enlargement of cardiac silhouette. Pulmonary vascular congestion. Minimally tortuous thoracic aorta. Decreased lung volumes with minimal atelectasis at mid-to-lower right lung. No gross infiltrate or effusion.  IMPRESSION: Enlargement of cardiac silhouette with pulmonary vascular congestion. Decreased lung volumes with minimal atelectasis right lung as above.  Original Report Authenticated By: Lollie Marrow, M.D.   Ct Head Wo Contrast  10/18/2011  *RADIOLOGY REPORT*  Clinical Data: Suspected seizure.  Sudden episode of staring and nonverbal behavior.  CT HEAD WITHOUT CONTRAST  Technique:  Contiguous axial images were obtained from the base of the skull through the vertex without contrast.  Comparison: 10/17/2011.   Findings: There is no evidence for acute infarction, intracranial hemorrhage, mass lesion, hydrocephalus, or extra-axial fluid. There is no atrophy or white matter disease.  Temporal lobes are symmetric.  Calvarium is intact. Empty sella.  Visualized sinuses and mastoids are clear.  There are no orbital findings.  Compared with yesterday's exam, there is no change although the brain is better visualized on today's study because of less motion.  IMPRESSION: Empty sella, otherwise negative exam.  If there is concern for underlying seizure disorder, MRI of the brain without and with contrast on an elective basis could be helpful in further evaluation.  Original Report Authenticated By: Elsie Stain, M.D.   Ct Head Wo Contrast  10/17/2011  *RADIOLOGY REPORT*  Clinical Data: Weakness and speech disturbance.  CT HEAD WITHOUT CONTRAST  Technique:  Contiguous axial images were obtained from the base of the skull through the vertex without contrast.  Comparison: None.  Findings: The examination suffers from some motion degradation. The brain does not show atrophy.  There is no evidence of old or acute infarction, mass lesion, hemorrhage, hydrocephalus or extra- axial collection.  Calvarium is unremarkable.  Sinuses, middle ears and mastoids are clear.  The sella appears enlarged, possibly due to arachnoid herniation.  No evidence of sellar or suprasellar mass.  IMPRESSION: Negative head CT.  Original Report Authenticated By: Thomasenia Sales, M.D.      Medications:  Scheduled:    . metFORMIN  1,000 mg Oral Q breakfast   And  . linagliptin  5  mg Oral Q breakfast  . norethindrone  5 mg Oral Daily  . sodium chloride  3 mL Intravenous Q12H  . Vitamin D (Ergocalciferol)  50,000 Units Oral 2 times weekly  . DISCONTD: Saxagliptin-Metformin  1 tablet Oral Daily  . DISCONTD: sodium chloride  500 mL Intravenous STAT   Continuous:  ZOX:[id number] chloride, sodium chloride  Impression:  Principal Problem:   *Confusion     Plan: Patient was calm all day then at 3:43 pm she jumped out of bed and asked to leave She went into the bathroom and would not want to come out She stopped communicating in a meaningful way  She claims she drove herself here - false She claims she has clothes on her - false  Etc  She currently lacks judgement to leave on her own I have encouraged her to call a family member up here to help with disposition For now she has to stay in house for safety Psychiatry and neurology consultation have been called      LOS: 1 day   Alaze Garverick, MD Pager: [phone] 10/18/2011, 3:42 PM

## 2011-10-18 NOTE — Progress Notes (Signed)
   CARE MANAGEMENT NOTE 10/18/2011  Patient:  Aimee Moore, Aimee Moore   Account Number:  [id number]  Date Initiated:  10/18/2011  Documentation initiated by:  Letha Cape  Subjective/Objective Assessment:   dx dm, ams  admit- lives with spouse.  pta independent.     Action/Plan:   Anticipated DC Date:  10/20/2011   Anticipated DC Plan:  HOME/SELF CARE      DC Planning Services  CM consult      Choice offered to / List presented to:             Status of service:  In process, will continue to follow Medicare Important Message given?   (If response is "NO", the following Medicare IM given date fields will be blank) Date Medicare IM given:   Date Additional Medicare IM given:    Discharge Disposition:    Per UR Regulation:    If discussed at Long Length of Stay Meetings, dates discussed:    Comments:  10/18/11 16:46 Letha Cape RN, BSN [phone] patient lives with spouse, pta independent, psych CSW following.

## 2011-10-19 DIAGNOSIS — R4182 Altered mental status, unspecified: Secondary | ICD-10-CM

## 2011-10-19 DIAGNOSIS — E118 Type 2 diabetes mellitus with unspecified complications: Secondary | ICD-10-CM | POA: Diagnosis present

## 2011-10-19 DIAGNOSIS — E669 Obesity, unspecified: Secondary | ICD-10-CM | POA: Diagnosis present

## 2011-10-19 DIAGNOSIS — N390 Urinary tract infection, site not specified: Secondary | ICD-10-CM | POA: Diagnosis present

## 2011-10-19 MED ORDER — CEFUROXIME AXETIL 250 MG PO TABS: 500.0000 mg | ORAL_TABLET | Freq: Two times a day (BID) | ORAL | Status: AC

## 2011-10-19 NOTE — Progress Notes (Signed)
   CARE MANAGEMENT NOTE 10/19/2011  Patient:  Aimee Moore, Aimee Moore   Account Number:  [id number]  Date Initiated:  10/18/2011  Documentation initiated by:  Letha Cape  Subjective/Objective Assessment:   dx dm, ams  admit- lives with spouse.  pta independent.     Action/Plan:   Anticipated DC Date:  10/20/2011   Anticipated DC Plan:  HOME/SELF CARE      DC Planning Services  CM consult      Choice offered to / List presented to:             Status of service:  Completed, signed off Medicare Important Message given?   (If response is "NO", the following Medicare IM given date fields will be blank) Date Medicare IM given:   Date Additional Medicare IM given:    Discharge Disposition:  HOME/SELF CARE  Per UR Regulation:    If discussed at Long Length of Stay Meetings, dates discussed:    Comments:  10/19/11 15:15 Letha Cape RN, BSN  [phone] patient for dc home with spouse, Psych CSW spoke with patient, no needs identified.  10/18/11 16:46 Letha Cape RN, BSN [phone] patient lives with spouse, pta independent, psych CSW following.

## 2011-10-19 NOTE — Progress Notes (Signed)
Discharge instructions reviewed with patient, prescription given to patient by MD Verbalize and understand all instructions.. Patient  boyfriend is at the bedside. Skin WNL

## 2011-10-19 NOTE — Progress Notes (Signed)
Clinical Social Work Psychiatry  Assessment    Presenting Symptoms/Problems: Patient brought in by a friend who reports she is acting out of her normal.  Reports she is confused and not easily arousaled.  Patient reports she had a recent infection causing her to be sick and not act in her norm.      Psychiatric History: reports no recent or past psych history, no history of psychosis, AVH, SI, or on medications.  Not currently seeing a counselor.      Family Collateral Information: No family at the bedside but also does not express any concern other than her AMS that has now cleared.                              Emotional Health/Current Symptoms:    Suicide/Self harm: Declined. No thoughts or behaviors that leads this writer to feel patient will harm herself or anyone else.      Attention/Behavioral/Psychotic Symptoms: Patient sitting in chair in normal dress.  Has good eye contact and normal speech.  Reports she is not having any current new stressor or problems at home.  Reports she works a full time job and has a boyfriend in which she lives with.  Denies being depressed or suicidal, denies any psychosis at this time or previously.  Reports she feels a lot better and the only reason she was upset and wanted to leave AMA was because she did not understand why she was here at the hospital in the first place.  Patient reports she was confused and aware, but if communicated better she would have complied.  Patient currently under IVC, but at this time patient has cleared and is able to follow command and has been cooperative.  I see no reason for patient to remain under IVC, thus will lift IVC.  Patient is alert and oriented x4 and able to give history about why she was admitted        Recent Loss/Stressor: declined      Substance Abuse/Use: declined      Interpretive Summary/Anticipated DC Plan:  Patient would like to return home at dc.  Has declined any assistance at this time for  any counseling or needs. Reports she does not take any medication, is not depressed nor anxious. 2.  Sitter at this time is not needed due to patient compliance and she is clear and not confused.  Have spoken to MD about this. 3.  Will lift IVC paperwork on patient, no need at this time.  No BHH at this time.  She is not SI, HI, or having AVH. 4.  Would anticipate dc home today.   Updated Psych MD who will see patient and follow up with discharge.  Will follow up if needed otherwise call with questions.  Ashley Jacobs, MSW LCSW [phone]

## 2011-10-19 NOTE — Consult Note (Addendum)
Reason for Consult:Altered Mental Status, Confusion Referring Physician:Dr. Tehillah Cipriani is an 41 y.o. female.  HPI: Pt is a 41 y/o AAF that presented to the hospital after being found non verbal by her boyfriend. He has lived with her for years. Does not report any history of psychiatric disease. In the ED work up was negative. Neuro was called and they requested further work-up with MRI, UDS, and EEG to rule out any non psychiatric cause. Orders have been placed and we were asked to admit patient. Patient looked in my direction and answered (with a nod) yes and no to only a few questions.   AXIS I Altered Mental Status, R/O TIA AXIS II Deferred AXIS III Past Medical History  Diagnosis Date  . Diabetes mellitus     History reviewed. No pertinent past surgical history. AXIS IV Social Support AXIS V  GAF  60  History reviewed. No pertinent family history.  Social History:  reports that she has never smoked. She does not have any smokeless tobacco history on file. She reports that she does not drink alcohol or use illicit drugs.  Allergies: No Known Allergies  Medications: I have reviewed the patient's current medications.  Results for orders placed during the hospital encounter of 10/17/11 (from the past 48 hour(s))  URINALYSIS, ROUTINE W REFLEX MICROSCOPIC     Status: Abnormal   Collection Time   10/17/11  9:30 PM      Component Value Range Comment   Color, Urine AMBER (*) YELLOW  BIOCHEMICALS MAY BE AFFECTED BY COLOR   APPearance CLOUDY (*) CLEAR     Specific Gravity, Urine 1.023  1.005 - 1.030     pH 5.5  5.0 - 8.0     Glucose, UA NEGATIVE  NEGATIVE (mg/dL)    Hgb urine dipstick SMALL (*) NEGATIVE     Bilirubin Urine SMALL (*) NEGATIVE     Ketones, ur 15 (*) NEGATIVE (mg/dL)    Protein, ur NEGATIVE  NEGATIVE (mg/dL)    Urobilinogen, UA 1.0  0.0 - 1.0 (mg/dL)    Nitrite NEGATIVE  NEGATIVE     Leukocytes, UA SMALL (*) NEGATIVE    DRUGS OF ABUSE SCREEN W ALC,  ROUTINE URINE     Status: Normal   Collection Time   10/17/11  9:30 PM      Component Value Range Comment   Amphetamine Screen, Ur NEGATIVE  Negative     Marijuana Metabolite NEGATIVE  Negative     Barbiturate Quant, Ur NEGATIVE  Negative     Methadone NEGATIVE  Negative     Propoxyphene NEGATIVE  Negative     Benzodiazepines. NEGATIVE  Negative     Phencyclidine (PCP) NEGATIVE  Negative     Cocaine Metabolites NEGATIVE  Negative     Opiate Screen, Urine NEGATIVE  Negative     Ethyl Alcohol <10  <10 (mg/dL)    Creatinine,U 161.0     URINE RAPID DRUG SCREEN (HOSP PERFORMED)     Status: Normal   Collection Time   10/17/11  9:30 PM      Component Value Range Comment   Opiates NONE DETECTED  NONE DETECTED     Cocaine NONE DETECTED  NONE DETECTED     Benzodiazepines NONE DETECTED  NONE DETECTED     Amphetamines NONE DETECTED  NONE DETECTED     Tetrahydrocannabinol NONE DETECTED  NONE DETECTED     Barbiturates NONE DETECTED  NONE DETECTED    URINE MICROSCOPIC-ADD ON  Status: Abnormal   Collection Time   10/17/11  9:30 PM      Component Value Range Comment   Squamous Epithelial / LPF FEW (*) RARE     WBC, UA 0-2  <3 (WBC/hpf)    RBC / HPF 3-6  <3 (RBC/hpf)    Bacteria, UA MANY (*) RARE    GLUCOSE, CAPILLARY     Status: Abnormal   Collection Time   10/18/11 12:21 AM      Component Value Range Comment   Glucose-Capillary 292 (*) 70 - 99 (mg/dL)    Comment 1 Notify RN      Comment 2 Documented in Chart     CBC     Status: Abnormal   Collection Time   10/18/11  2:09 PM      Component Value Range Comment   WBC 11.2 (*) 4.0 - 10.5 (K/uL)    RBC 4.63  3.87 - 5.11 (MIL/uL)    Hemoglobin 13.7  12.0 - 15.0 (g/dL)    HCT 16.1  09.6 - 04.5 (%)    MCV 89.2  78.0 - 100.0 (fL)    MCH 29.6  26.0 - 34.0 (pg)    MCHC 33.2  30.0 - 36.0 (g/dL)    RDW 40.9  81.1 - 91.4 (%)    Platelets 415 (*) 150 - 400 (K/uL)     Ct Head Wo Contrast  10/18/2011  *RADIOLOGY REPORT*  Clinical Data: Suspected  seizure.  Sudden episode of staring and nonverbal behavior.  CT HEAD WITHOUT CONTRAST  Technique:  Contiguous axial images were obtained from the base of the skull through the vertex without contrast.  Comparison: 10/17/2011.  Findings: There is no evidence for acute infarction, intracranial hemorrhage, mass lesion, hydrocephalus, or extra-axial fluid. There is no atrophy or white matter disease.  Temporal lobes are symmetric.  Calvarium is intact. Empty sella.  Visualized sinuses and mastoids are clear.  There are no orbital findings.  Compared with yesterday's exam, there is no change although the brain is better visualized on today's study because of less motion.  IMPRESSION: Empty sella, otherwise negative exam.  If there is concern for underlying seizure disorder, MRI of the brain without and with contrast on an elective basis could be helpful in further evaluation.  Original Report Authenticated By: Elsie Stain, M.D.    Review of Systems  Unable to perform ROS: other   Blood pressure 145/90, pulse 112, temperature 98.2 F (36.8 C), temperature source Oral, resp. rate 19, height 5\' 3"  (1.6 m), weight 186.7 kg (411 lb 9.6 oz), SpO2 95.00%. Physical Exam  Assessment/Plan: Chart Reviewed  Discussed with Psych CSW  Pt is interviewed ~ 2: 15 pm 3/ 22/ 13 Pt is sitting in bed with friend, Delton See, visiting.  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  When she was supposed to get up for work, she was not responsive.  Her friend called a friend; then called EMS.  Pt says she does not recall anything after riding in the ambulance.  Notes document that she had progressive restoration of speech.  She is awake, alert and oriented during interview.  She describes her work.  She has spontaneous speech.  She has good eye contact.  She cannot explain her inability to talk but speech is now logical,  organized and sequential.  She  feels ready to return home.  She denies any suicidal;homicidal ideation.  She denies any drugs/alcohol or tobacco use. She knows her medications prior to this admission.  RECOMMENDATION 1. Temporary loss of communication skills with progressive normalization; suggestive of but not soley due to TIA.  2.  Pt is presumed to be at baseline.  Thank you for this consultation  Shealee Yordy 10/19/2011, 5:15 PM

## 2011-10-19 NOTE — Progress Notes (Signed)
[facility] PROGRESS NOTE    SUBJECTIVE  Patient is back to her baseline. Able to hold a conversation with me and follow all my commands.  She remembers the ambulance ride but cannot remember any other aspect of the hospitalization. She still denies any stressors in her life and denies any drug use or change in Rx medications.    OBJECTIVE   Vital signs in last 24 hours: Temp:  [98.1 F (36.7 C)-98.3 F (36.8 C)] 98.2 F (36.8 C) (03/22 0436) Pulse Rate:  [92-112] 112  (03/22 0436) Resp:  [18-19] 19  (03/22 0436) BP: (130-147)/(66-93) 145/90 mmHg (03/22 0436) SpO2:  [95 %-98 %] 95 % (03/22 0436)  Intake/Output from previous day: 03/21 0701 - 03/22 0700 In: 480 [P.O.:480] Out: -  Intake/Output this shift: Total I/O In: 240 [P.O.:240] Out: -  Nutritional status: General  Past Medical History  Diagnosis Date  . Diabetes mellitus     Neurologic Exam:   Mental Status: Alert, oriented, thought content appropriate.  Speech fluent without evidence of aphasia. Able to follow 3 step commands without difficulty. Cranial Nerves: II-Visual fields grossly intact. III/IV/VI-Extraocular movements intact.  Pupils reactive bilaterally. V/VII-Smile symmetric VIII-grossly intact IX/X-normal gag XI-bilateral shoulder shrug XII-midline tongue extension Motor: 5/5 bilaterally with normal tone and bulk Sensory: Pinprick and light touch intact throughout, bilaterally Deep Tendon Reflexes: 1+ and symmetric throughout Plantars: Downgoing bilaterally Cerebellar: Normal finger-to-nose, normal heel-to-shin test.  Normal gait and station.  Lab Results: No results found for this basename: cbc, bmp, coags, chol, tri, ldl, hga1c   Lipid Panel No results found for this basename: CHOL,TRIG,HDL,CHOLHDL,VLDL,LDLCALC in the last 72 hours  Studies/Results: Dg Chest 2 View  10/17/2011  *RADIOLOGY REPORT*  Clinical Data: Shortness of breath,  confusion, history diabetes  CHEST - 2 VIEW  Comparison: 08/05/2009  Findings: Lateral view nondiagnostic due to body habitus and light technique. Enlargement of cardiac silhouette. Pulmonary vascular congestion. Minimally tortuous thoracic aorta. Decreased lung volumes with minimal atelectasis at mid-to-lower right lung. No gross infiltrate or effusion.  IMPRESSION: Enlargement of cardiac silhouette with pulmonary vascular congestion. Decreased lung volumes with minimal atelectasis right lung as above.  Original Report Authenticated By: Lollie Marrow, M.D.   Ct Head Wo Contrast  10/18/2011  *RADIOLOGY REPORT*  Clinical Data: Suspected seizure.  Sudden episode of staring and nonverbal behavior.  CT HEAD WITHOUT CONTRAST  Technique:  Contiguous axial images were obtained from the base of the skull through the vertex without contrast.  Comparison: 10/17/2011.  Findings: There is no evidence for acute infarction, intracranial hemorrhage, mass lesion, hydrocephalus, or extra-axial fluid. There is no atrophy or white matter disease.  Temporal lobes are symmetric.  Calvarium is intact. Empty sella.  Visualized sinuses and mastoids are clear.  There are no orbital findings.  Compared with yesterday's exam, there is no change although the brain is better visualized on today's study because of less motion.  IMPRESSION: Empty sella, otherwise negative exam.  If there is concern for underlying seizure disorder, MRI of the brain without and with contrast on an elective basis could be helpful in further evaluation.  Original Report Authenticated By: Elsie Stain, M.D.   Ct Head Wo Contrast  10/17/2011  *RADIOLOGY REPORT*  Clinical Data: Weakness and speech disturbance.  CT HEAD WITHOUT CONTRAST  Technique:  Contiguous axial images were obtained from the base of the skull through the vertex without contrast.  Comparison: None.  Findings: The examination suffers from some motion degradation. The brain does not show  atrophy.  There is no evidence of old or acute infarction, mass lesion, hemorrhage, hydrocephalus or extra- axial collection.  Calvarium is unremarkable.  Sinuses, middle ears and mastoids are clear.  The sella appears enlarged, possibly due to arachnoid herniation.  No evidence of sellar or suprasellar mass.  IMPRESSION: Negative head CT.  Original Report Authenticated By: Thomasenia Sales, M.D.    Medications:     Scheduled:   . metFORMIN  1,000 mg Oral Q breakfast   And  . linagliptin  5 mg Oral Q breakfast  . norethindrone  5 mg Oral Daily  . sodium chloride  3 mL Intravenous Q12H  . Vitamin D (Ergocalciferol)  50,000 Units Oral 2 times weekly    Assessment/Plan:    Patient Active Hospital Problem List: Confusion (10/18/2011)   Assessment: resolved   Plan: no further recommendations.  Neurology will sign off.    Felicie Morn PA-C [facility] [phone]  10/19/2011, 12:04 PM

## 2011-10-19 NOTE — Discharge Summary (Signed)
Patient ID: Aimee Moore MRN: [id number] DOB/AGE: [birth year] 41 y.o. Primary Care Physician:SHELTON,KIMBERLY R., MD, MD Admit date: 10/17/2011 Discharge date: 10/19/2011    Discharge Diagnoses:   Principal Problem:  *Confusion Active Problems:  DM type 2 causing complication  Obesity (BMI 35.0-39.9 without comorbidity)  UTI (urinary tract infection)  probable delirium versus seizure  Medication List  As of 10/19/2011  2:31 PM   START taking these medications         cefUROXime 250 MG tablet   Commonly known as: CEFTIN   Take 2 tablets (500 mg total) by mouth 2 (two) times daily.         CONTINUE taking these medications         norethindrone 5 MG tablet   Commonly known as: AYGESTIN      Saxagliptin-Metformin 2.11-998 MG Tb24      Vitamin D (Ergocalciferol) 50000 UNITS Caps   Commonly known as: DRISDOL          Where to get your medications    These are the prescriptions that you need to pick up.   You may get these medications from any pharmacy.         cefUROXime 250 MG tablet            Discharged Condition: Alert oriented x3 calm cooperative    Consults: Neurology and psychiatry  Significant Diagnostic Studies: Dg Chest 2 View  10/17/2011  *RADIOLOGY REPORT*  Clinical Data: Shortness of breath, confusion, history diabetes  CHEST - 2 VIEW  Comparison: 08/05/2009  Findings: Lateral view nondiagnostic due to body habitus and light technique. Enlargement of cardiac silhouette. Pulmonary vascular congestion. Minimally tortuous thoracic aorta. Decreased lung volumes with minimal atelectasis at mid-to-lower right lung. No gross infiltrate or effusion.  IMPRESSION: Enlargement of cardiac silhouette with pulmonary vascular congestion. Decreased lung volumes with minimal atelectasis right lung as above.  Original Report Authenticated By: Lollie Marrow, M.D.   Ct Head Wo Contrast  10/18/2011  *RADIOLOGY REPORT*  Clinical Data: Suspected seizure.  Sudden episode  of staring and nonverbal behavior.  CT HEAD WITHOUT CONTRAST  Technique:  Contiguous axial images were obtained from the base of the skull through the vertex without contrast.  Comparison: 10/17/2011.  Findings: There is no evidence for acute infarction, intracranial hemorrhage, mass lesion, hydrocephalus, or extra-axial fluid. There is no atrophy or white matter disease.  Temporal lobes are symmetric.  Calvarium is intact. Empty sella.  Visualized sinuses and mastoids are clear.  There are no orbital findings.  Compared with yesterday's exam, there is no change although the brain is better visualized on today's study because of less motion.  IMPRESSION: Empty sella, otherwise negative exam.  If there is concern for underlying seizure disorder, MRI of the brain without and with contrast on an elective basis could be helpful in further evaluation.  Original Report Authenticated By: Elsie Stain, M.D.   Ct Head Wo Contrast  10/17/2011  *RADIOLOGY REPORT*  Clinical Data: Weakness and speech disturbance.  CT HEAD WITHOUT CONTRAST  Technique:  Contiguous axial images were obtained from the base of the skull through the vertex without contrast.  Comparison: None.  Findings: The examination suffers from some motion degradation. The brain does not show atrophy.  There is no evidence of old or acute infarction, mass lesion, hemorrhage, hydrocephalus or extra- axial collection.  Calvarium is unremarkable.  Sinuses, middle ears and mastoids are clear.  The sella appears enlarged, possibly due to arachnoid herniation.  No  evidence of sellar or suprasellar mass.  IMPRESSION: Negative head CT.  Original Report Authenticated By: Thomasenia Sales, M.D.    Lab Results: Results for orders placed during the hospital encounter of 10/17/11 (from the past 48 hour(s))  GLUCOSE, CAPILLARY     Status: Abnormal   Collection Time   10/17/11  3:39 PM      Component Value Range Comment   Glucose-Capillary 128 (*) 70 - 99 (mg/dL)     CULTURE, BLOOD (ROUTINE X 2)     Status: Normal (Preliminary result)   Collection Time   10/17/11  4:03 PM      Component Value Range Comment   Specimen Description BLOOD ARM RIGHT      Special Requests BOTTLES DRAWN AEROBIC ONLY 5CC      Culture  Setup Time [id number]      Culture        Value:        BLOOD CULTURE RECEIVED NO GROWTH TO DATE CULTURE WILL BE HELD FOR 5 DAYS BEFORE ISSUING A FINAL NEGATIVE REPORT   Report Status PENDING     TSH     Status: Normal   Collection Time   10/17/11  4:04 PM      Component Value Range Comment   TSH 1.985  0.350 - 4.500 (uIU/mL)   CULTURE, BLOOD (ROUTINE X 2)     Status: Normal (Preliminary result)   Collection Time   10/17/11  4:19 PM      Component Value Range Comment   Specimen Description BLOOD ARM LEFT      Special Requests BOTTLES DRAWN AEROBIC AND ANAEROBIC B 10CC R 5CC      Culture  Setup Time [id number]      Culture        Value:        BLOOD CULTURE RECEIVED NO GROWTH TO DATE CULTURE WILL BE HELD FOR 5 DAYS BEFORE ISSUING A FINAL NEGATIVE REPORT   Report Status PENDING     LACTIC ACID, PLASMA     Status: Normal   Collection Time   10/17/11  4:21 PM      Component Value Range Comment   Lactic Acid, Venous 1.0  0.5 - 2.2 (mmol/L)   AMMONIA     Status: Normal   Collection Time   10/17/11  4:22 PM      Component Value Range Comment   Ammonia 23  11 - 60 (umol/L)   URINALYSIS, ROUTINE W REFLEX MICROSCOPIC     Status: Abnormal   Collection Time   10/17/11  9:30 PM      Component Value Range Comment   Color, Urine AMBER (*) YELLOW  BIOCHEMICALS MAY BE AFFECTED BY COLOR   APPearance CLOUDY (*) CLEAR     Specific Gravity, Urine 1.023  1.005 - 1.030     pH 5.5  5.0 - 8.0     Glucose, UA NEGATIVE  NEGATIVE (mg/dL)    Hgb urine dipstick SMALL (*) NEGATIVE     Bilirubin Urine SMALL (*) NEGATIVE     Ketones, ur 15 (*) NEGATIVE (mg/dL)    Protein, ur NEGATIVE  NEGATIVE (mg/dL)    Urobilinogen, UA 1.0  0.0 - 1.0 (mg/dL)    Nitrite  NEGATIVE  NEGATIVE     Leukocytes, UA SMALL (*) NEGATIVE    DRUGS OF ABUSE SCREEN W ALC, ROUTINE URINE     Status: Normal   Collection Time   10/17/11  9:30 PM  Component Value Range Comment   Amphetamine Screen, Ur NEGATIVE  Negative     Marijuana Metabolite NEGATIVE  Negative     Barbiturate Quant, Ur NEGATIVE  Negative     Methadone NEGATIVE  Negative     Propoxyphene NEGATIVE  Negative     Benzodiazepines. NEGATIVE  Negative     Phencyclidine (PCP) NEGATIVE  Negative     Cocaine Metabolites NEGATIVE  Negative     Opiate Screen, Urine NEGATIVE  Negative     Ethyl Alcohol <10  <10 (mg/dL)    Creatinine,U 409.8     URINE RAPID DRUG SCREEN (HOSP PERFORMED)     Status: Normal   Collection Time   10/17/11  9:30 PM      Component Value Range Comment   Opiates NONE DETECTED  NONE DETECTED     Cocaine NONE DETECTED  NONE DETECTED     Benzodiazepines NONE DETECTED  NONE DETECTED     Amphetamines NONE DETECTED  NONE DETECTED     Tetrahydrocannabinol NONE DETECTED  NONE DETECTED     Barbiturates NONE DETECTED  NONE DETECTED    URINE MICROSCOPIC-ADD ON     Status: Abnormal   Collection Time   10/17/11  9:30 PM      Component Value Range Comment   Squamous Epithelial / LPF FEW (*) RARE     WBC, UA 0-2  <3 (WBC/hpf)    RBC / HPF 3-6  <3 (RBC/hpf)    Bacteria, UA MANY (*) RARE    GLUCOSE, CAPILLARY     Status: Abnormal   Collection Time   10/18/11 12:21 AM      Component Value Range Comment   Glucose-Capillary 292 (*) 70 - 99 (mg/dL)    Comment 1 Notify RN      Comment 2 Documented in Chart     CBC     Status: Abnormal   Collection Time   10/18/11  2:09 PM      Component Value Range Comment   WBC 11.2 (*) 4.0 - 10.5 (K/uL)    RBC 4.63  3.87 - 5.11 (MIL/uL)    Hemoglobin 13.7  12.0 - 15.0 (g/dL)    HCT 11.9  14.7 - 82.9 (%)    MCV 89.2  78.0 - 100.0 (fL)    MCH 29.6  26.0 - 34.0 (pg)    MCHC 33.2  30.0 - 36.0 (g/dL)    RDW 56.2  13.0 - 86.5 (%)    Platelets 415 (*) 150 - 400  (K/uL)    Recent Results (from the past 240 hour(s))  CULTURE, BLOOD (ROUTINE X 2)     Status: Normal (Preliminary result)   Collection Time   10/17/11  4:03 PM      Component Value Range Status Comment   Specimen Description BLOOD ARM RIGHT   Final    Special Requests BOTTLES DRAWN AEROBIC ONLY 5CC   Final    Culture  Setup Time [id number]   Final    Culture     Final    Value:        BLOOD CULTURE RECEIVED NO GROWTH TO DATE CULTURE WILL BE HELD FOR 5 DAYS BEFORE ISSUING A FINAL NEGATIVE REPORT   Report Status PENDING   Incomplete   CULTURE, BLOOD (ROUTINE X 2)     Status: Normal (Preliminary result)   Collection Time   10/17/11  4:19 PM      Component Value Range Status Comment   Specimen Description BLOOD  ARM LEFT   Final    Special Requests BOTTLES DRAWN AEROBIC AND ANAEROBIC B 10CC R 5CC   Final    Culture  Setup Time [id number]   Final    Culture     Final    Value:        BLOOD CULTURE RECEIVED NO GROWTH TO DATE CULTURE WILL BE HELD FOR 5 DAYS BEFORE ISSUING A FINAL NEGATIVE REPORT   Report Status PENDING   Incomplete      Hospital Course:  41 year old woman without significant past medical history was admitted from the emergency room with agitated delirium. Apply she cannot find her words became confused and the boyfriend brought her to the emergency room. In the emergency room she was combative so she received a dose of Geodon, make initial evaluation difficult. The patient was screened by Dr. Thana Farr from neurology and she recommended observation in the hospital. On hospital day #2 Ms. Moncrief didn't recall events of the prior day and she was calm and cooperative. At 4 PM on hospital day #2 the patient became agitated, claimed that she is going to  drive her car home and that she wanted to leave. She did not arrive in the car the hospital so we could not allow her to go in the hospital gown in the parking lot to freeze to death. She was committed against her will until  she considers a safer plan for discharge. By hospital day #3 Ms. Donahoe was alert and oriented and she had some partial recollection of events that happen on hospital day #2. Unfortunately despite extensive workup with 2 CT scans of the head, electroencephalogram, extensive blood work and cultures we could not fully explain why the patient was so confused for 48 hours. The patient was treated empirically for possible urinary tract infection and her family was instructed to bring her back to the hospital if she has symptoms again.  Discharge Exam: Blood pressure 145/90, pulse 112, temperature 98.2 F (36.8 C), temperature source Oral, resp. rate 19, height 5\' 3"  (1.6 m), weight 186.7 kg (411 lb 9.6 oz), SpO2 95.00%. Alert and oriented x3 CVS: RRR RS: ctab Neuro : non focal   Disposition: home   Discharge Orders    Future Orders Please Complete By Expires   Diet Carb Modified      Increase activity slowly         Follow-up Information    Schedule an appointment as soon as possible for a visit with Alva Garnet., MD.   Contact information:   [address] [place] [address] [phone]          Signed: Lonia Blood 10/19/2011, 2:31 PM

## 2011-10-24 LAB — CULTURE, BLOOD (ROUTINE X 2): Culture  Setup Time: 201303210122

## 2011-11-19 NOTE — Procedures (Signed)
EEG NUMBER:  REFERRING PHYSICIAN:  Sorin Lavera Guise, MD  HISTORY:  A 41 year old female with altered mental status.  MEDICATIONS:  Geodon.  CONDITIONS OF RECORDING:  This is a 16 channel EEG carried out with the patient in the awake state.  DESCRIPTION:  The waking background activity consists of a low-voltage, symmetrical, fairly well-organized 7 Hz theta activity seen from the parieto-occipital and posterotemporal regions.  Low-voltage, fast activity, poorly organized was seen anteriorly and at times superimposed on more posterior rhythms.  A mixture of theta and alpha was seen from the central and temporal regions.  The patient does not drowse or sleep. Hypoventilation was not performed.  Intermittent photic stimulation failed to elicit any change in the tracing.  IMPRESSION:  This is an abnormal EEG, secondary to posterior background slowing.  This finding can be seen in a diffuse gray matter disturbance that is etiologically nonspecific, but may include dementia/static encephalopathy among other possibilities.  Cannot rule out a medication effect related to Geodon as well.  No epileptiform activity was noted.  COMMENT:  An EEG with the patient sleep deprived to elicit drowse and light sleep may be considered to further elicit a possible seizure disorder.          ______________________________ Thana Farr, MD    [id number] D:  10/18/2011 17:53:17  T:  10/18/2011 21:56:33  Job #:  [id number]

## 2013-01-23 IMAGING — CT CT HEAD W/O CM
1 of 2 series · 13 of 30 positions shown, 17 images · non-contrast
Comparison: 10/17/2011.

CLINICAL DATA: Suspected seizure.  Sudden episode of staring and
nonverbal behavior.

CT HEAD WITHOUT CONTRAST
TECHNIQUE: Contiguous axial images were obtained from the base of
the skull through the vertex without contrast.

[Series 2: brain · axial · 0.47mm/px · z∈[+123,+256]mm · 13 of 32 slices shown, 17 images]
[im 3/32  brain]
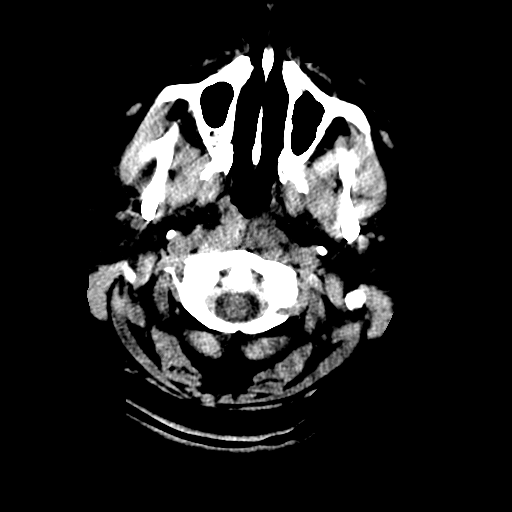
[im 3/32  bone]
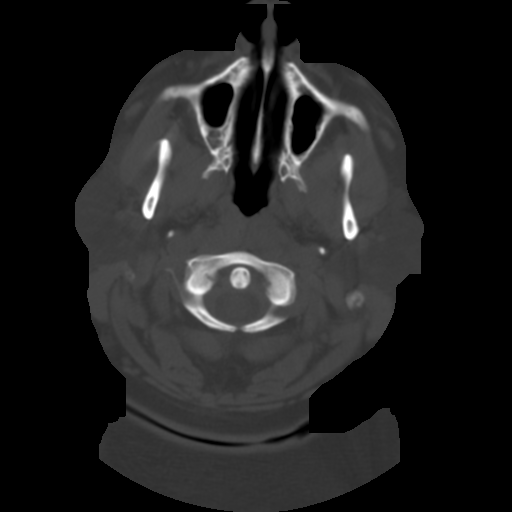
[im 5/32  brain]
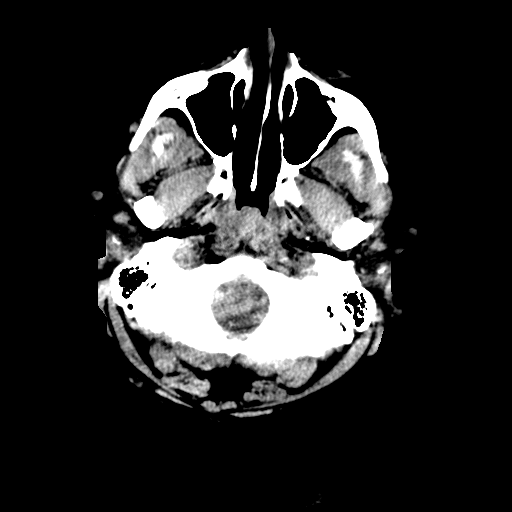
[im 7/32  brain]
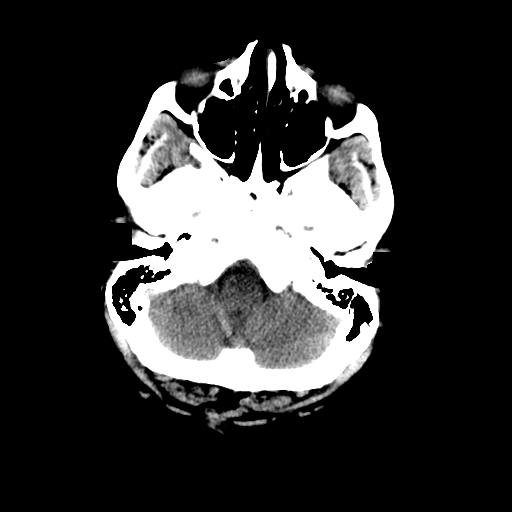
[im 9/32  brain]
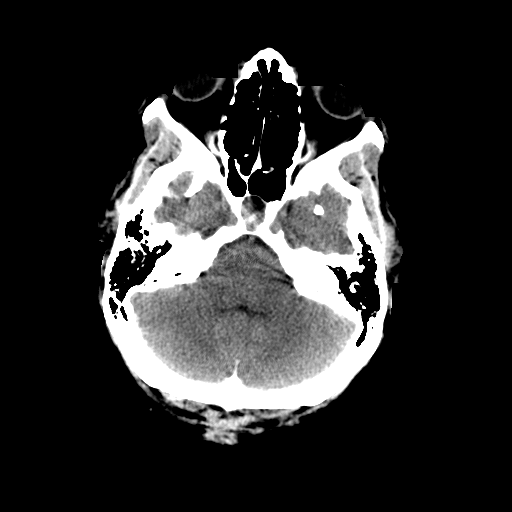
[im 12/32  brain]
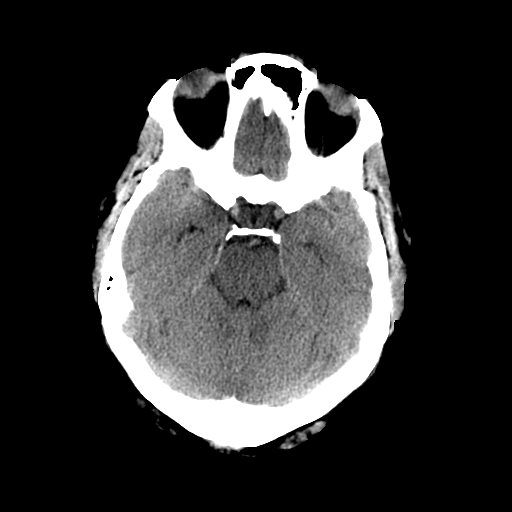
[im 12/32  bone]
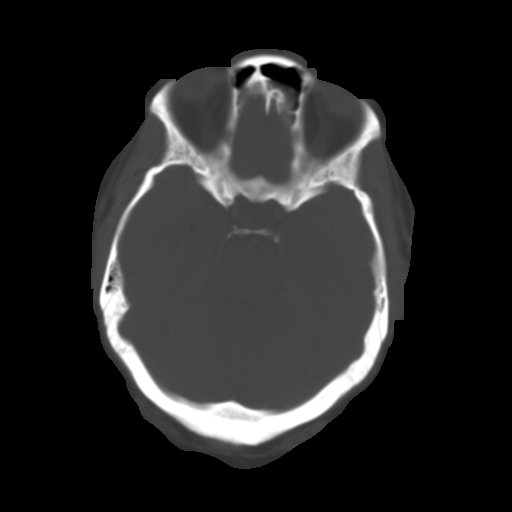
[im 14/32  brain]
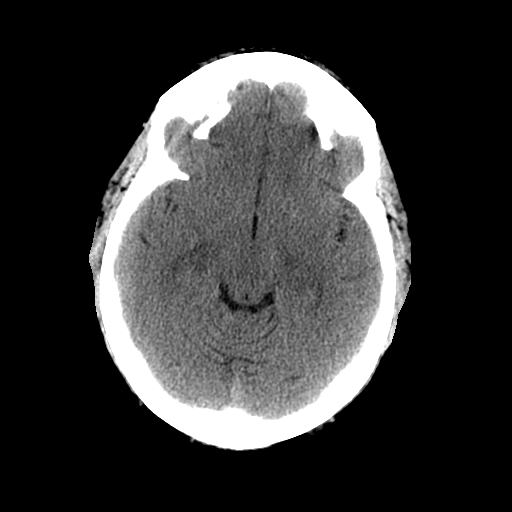
[im 16/32  brain]
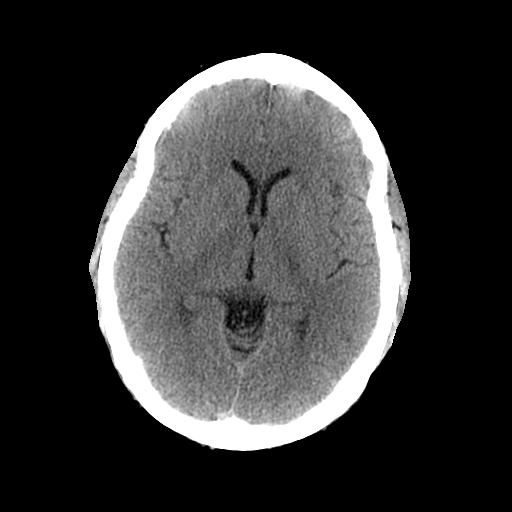
[im 18/32  brain]
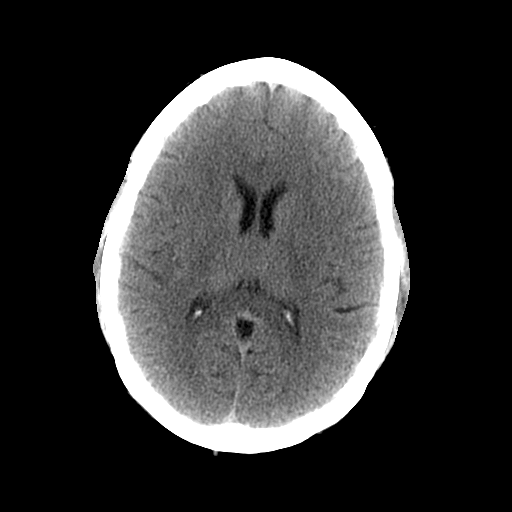
[im 20/32  brain]
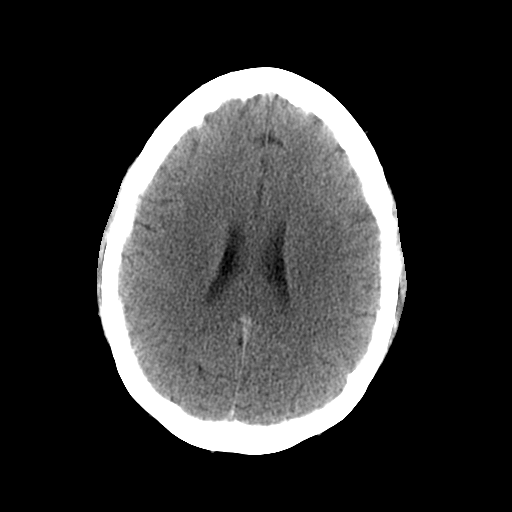
[im 20/32  bone]
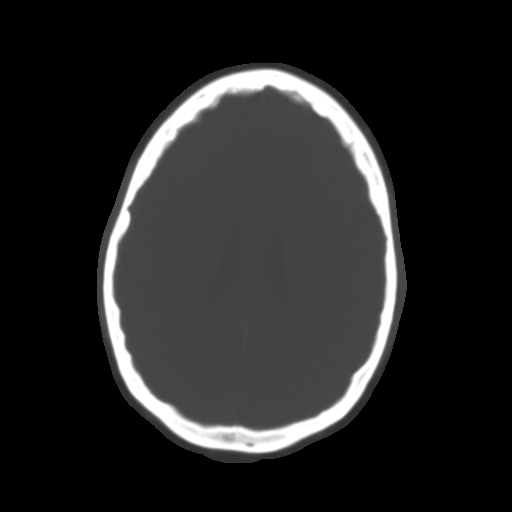
[im 23/32  brain]
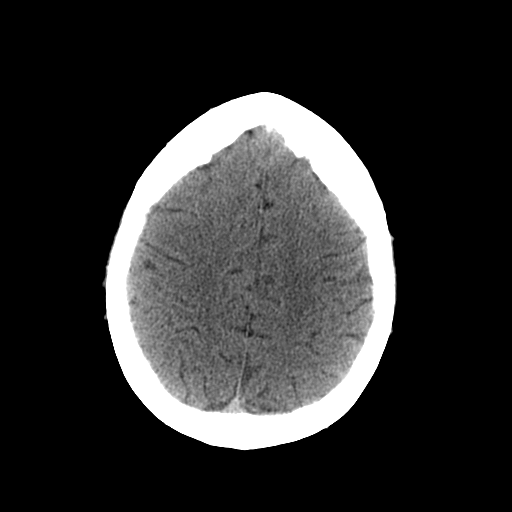
[im 25/32  brain]
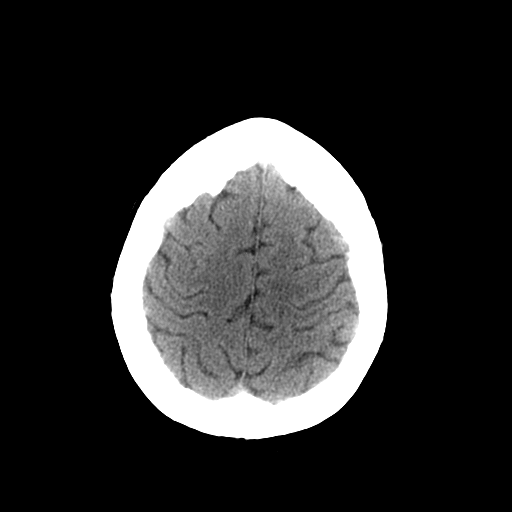
[im 27/32  brain]
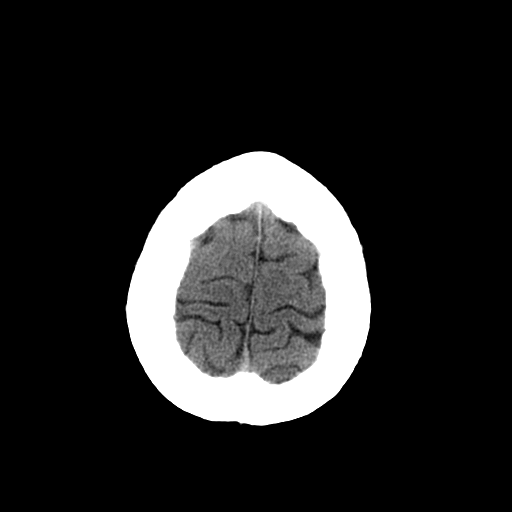
[im 29/32  brain]
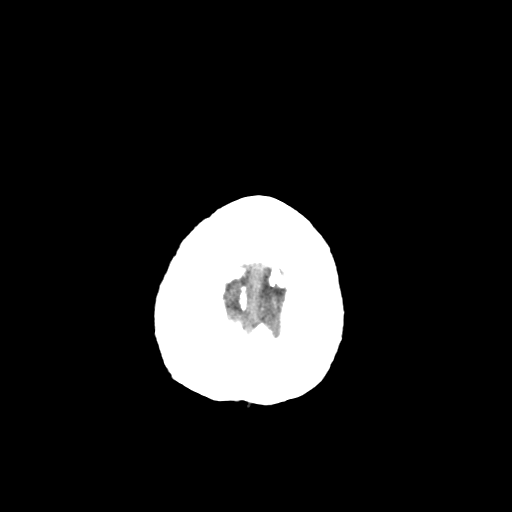
[im 29/32  bone]
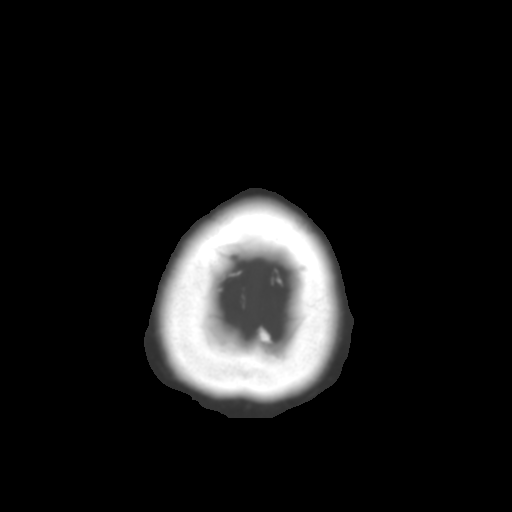

[13 of 30 positions shown; findings below may reference images not displayed]

FINDINGS: There is no evidence for acute infarction, intracranial
hemorrhage, mass lesion, hydrocephalus, or extra-axial fluid.
There is no atrophy or white matter disease.  Temporal lobes are
symmetric.  Calvarium is intact. Empty sella.  Visualized sinuses
and mastoids are clear.  There are no orbital findings.

Compared with yesterday's exam, there is no change although the
brain is better visualized on today's study because of less motion.
IMPRESSION: Empty sella, otherwise negative exam.  If there is concern for
underlying seizure disorder, MRI of the brain without and with
contrast on an elective basis could be helpful in further
evaluation.

## 2013-11-16 ENCOUNTER — Other Ambulatory Visit: Payer: Self-pay | Admitting: Internal Medicine

## 2013-11-16 DIAGNOSIS — Z1231 Encounter for screening mammogram for malignant neoplasm of breast: Secondary | ICD-10-CM

## 2013-11-17 ENCOUNTER — Other Ambulatory Visit: Payer: Self-pay | Admitting: Internal Medicine

## 2013-11-17 ENCOUNTER — Other Ambulatory Visit: Payer: Self-pay | Admitting: Obstetrics and Gynecology

## 2013-11-17 DIAGNOSIS — Z1231 Encounter for screening mammogram for malignant neoplasm of breast: Secondary | ICD-10-CM

## 2015-06-06 ENCOUNTER — Telehealth: Payer: Self-pay | Admitting: Nurse Practitioner

## 2015-06-06 ENCOUNTER — Ambulatory Visit (INDEPENDENT_AMBULATORY_CARE_PROVIDER_SITE_OTHER): Payer: Commercial Managed Care - HMO | Admitting: Nurse Practitioner

## 2015-06-06 ENCOUNTER — Other Ambulatory Visit: Payer: Self-pay | Admitting: Nurse Practitioner

## 2015-06-06 ENCOUNTER — Encounter: Payer: Self-pay | Admitting: Nurse Practitioner

## 2015-06-06 VITALS — BP 140/84 | HR 76 | Ht 62.0 in | Wt 329.0 lb

## 2015-06-06 DIAGNOSIS — Z1231 Encounter for screening mammogram for malignant neoplasm of breast: Secondary | ICD-10-CM

## 2015-06-06 DIAGNOSIS — Z01419 Encounter for gynecological examination (general) (routine) without abnormal findings: Secondary | ICD-10-CM | POA: Diagnosis not present

## 2015-06-06 DIAGNOSIS — Z0000 Encounter for general adult medical examination without abnormal findings: Secondary | ICD-10-CM

## 2015-06-06 DIAGNOSIS — N939 Abnormal uterine and vaginal bleeding, unspecified: Secondary | ICD-10-CM

## 2015-06-06 LAB — POCT URINALYSIS DIPSTICK
BILIRUBIN UA: NEGATIVE
GLUCOSE UA: NEGATIVE
Ketones, UA: NEGATIVE
Leukocytes, UA: NEGATIVE
NITRITE UA: NEGATIVE
Protein, UA: NEGATIVE
RBC UA: NEGATIVE
Urobilinogen, UA: NEGATIVE
pH, UA: 6.5

## 2015-06-06 MED ORDER — NORETHINDRONE ACETATE 5 MG PO TABS: 5.0000 mg | ORAL_TABLET | Freq: Every day | ORAL | Status: DC

## 2015-06-06 NOTE — Progress Notes (Signed)
Pt needs PUS and endometrial biopsy.  You can do Rx for Aygestin for one month.  Please cc me with pap results.  Thanks.

## 2015-06-06 NOTE — Telephone Encounter (Signed)
Patient is informed of consultation notes with Dr. Hyacinth MeekerMiller and [facility].  She is aware that 1 month only of her Aygestin is sent to her pharmacy.  Stressed that this is very important for her to get followed.  She is in agreement with plan.

## 2015-06-06 NOTE — Patient Instructions (Signed)

## 2015-06-06 NOTE — Progress Notes (Signed)
Scheduled patient while in office for bilateral screening mammogram at [facility] on 11/23 at 12 pm. Patient is agreeable to date and time.

## 2015-06-06 NOTE — Progress Notes (Signed)
Patient ID: Aimee Moore, female   DOB: 1970-10-09, 44 y.o.   MRN: [id number] 44 y.o. G0P0 Single  Caucasian Fe here for NGYN annual exam.  She has been a former patient her of Dr. Hyacinth MeekerMiller.  She has history of AGUS pap ? And abnormal findings on Endo Biopsy In 06/2008  and had D&C on 08/23/2008.  She was put on Aygestin for menorrhagia and has remained on this.  In 2011 saw someone with [facility] and had another pap which was normal. Menses are monthly lasting 5 days.  First 2 days moderate to heavy changing extra long regular pad every 2-3 hours.  Slight cramps. Dates but not SA since 2013.  Patient's last menstrual period was 05/19/2015 (exact date).          Sexually active: No.  The current method of family planning is abstinence.    Exercising: No.  Exercise is limited by issues with left knee.  Pt was using stationary bike at gym prior to that.. Smoker:  no  Health Maintenance: Pap:  05/08/10, negative with neg HR HPV TD:  2000 Labs: Dr. Renae GlossShelton   reports that she has never smoked. She has never used smokeless tobacco. She reports that she does not drink alcohol or use illicit drugs.  Past Medical History  Diagnosis Date  . Diabetes mellitus   . Abnormal uterine bleeding     Past Surgical History  Procedure Laterality Date  . Dilation and curettage of uterus  08/23/2008    papillary synctial metaplasia    Current Outpatient Prescriptions  Medication Sig Dispense Refill  . Cholecalciferol (VITAMIN D3) 5000 UNITS CAPS Take 1 capsule by mouth daily.    . meloxicam (MOBIC) 15 MG tablet Take 1 tablet by mouth daily as needed.  2  . metFORMIN (GLUCOPHAGE) 500 MG tablet Take by mouth 2 (two) times daily with a meal.    . norethindrone (AYGESTIN) 5 MG tablet Take 5 mg by mouth daily.     No current facility-administered medications for this visit.    Family History  Problem Relation Age of Onset  . Hypertension Mother   . Lupus Mother   . Diabetes Father   .  Hypertension Sister   . Cancer Maternal Aunt     ovarian or uterine?  . Diabetes Paternal Aunt   . Diabetes Paternal Uncle   . Cancer Maternal Grandfather     prostate  . Diabetes Paternal Grandmother     ROS:  Pertinent items are noted in HPI.  Otherwise, a comprehensive ROS was negative.  Exam:   BP 140/84 mmHg  Pulse 76  Ht 5\' 2"  (1.575 m)  Wt 329 lb (149.233 kg)  BMI 60.16 kg/m2  LMP 05/19/2015 (Exact Date) Height: 5\' 2"  (157.5 cm) Ht Readings from Last 3 Encounters:  06/06/15 5\' 2"  (1.575 m)  10/17/11 5\' 3"  (1.6 m)    General appearance: alert, cooperative and appears stated age Head: Normocephalic, without obvious abnormality, atraumatic Neck: no adenopathy, supple, symmetrical, trachea midline and thyroid normal to inspection and palpation Lungs: clear to auscultation bilaterally Breasts: normal appearance, no masses or tenderness Heart: regular rate and rhythm Abdomen: soft, non-tender; no masses,  no organomegaly Extremities: extremities normal, atraumatic, no cyanosis or edema Skin: Skin color, texture, turgor normal. No rashes or lesions Lymph nodes: Cervical, supraclavicular, and axillary nodes normal. No abnormal inguinal nodes palpated Neurologic: Grossly normal   Pelvic: External genitalia:  no lesions  Urethra:  normal appearing urethra with no masses, tenderness or lesions              Bartholin's and Skene's: normal                 Vagina: normal appearing vagina with normal color and discharge, no lesions              Cervix: anteverted              Pap taken: Yes.   hard to visualize cervix due to size and cooperation Bimanual Exam:  Uterus:  normal size, contour, position, consistency, mobility, non-tender              Adnexa: no mass, fullness, tenderness Exam is very limited due to size and cooperation               Rectovaginal: Confirms               Anus:  normal sphincter tone, no lesions  Chaperone present: yes  A:  Well Woman  with normal exam  History of menorrhagia in 2009 with AGUS? 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

## 2015-06-07 ENCOUNTER — Telehealth: Payer: Self-pay | Admitting: Obstetrics & Gynecology

## 2015-06-07 DIAGNOSIS — N939 Abnormal uterine and vaginal bleeding, unspecified: Secondary | ICD-10-CM

## 2015-06-07 NOTE — Telephone Encounter (Signed)
Reviewed benefits with patient for sonohysterogram. Patient understands and agreeable. Patient ready to schedule. Offered first available appointment. Patient scheduled for 06/16/15 with Dr Hyacinth MeekerMiller per patient request. Patient understands and agreeable to 72 hour cancellation policy with $150 fee. Patient agreeable to arrival date/time. No further questions. Ok to close.

## 2015-06-08 LAB — IPS PAP TEST WITH HPV

## 2015-06-15 NOTE — Addendum Note (Signed)
Addended by: Joeseph AmorFAST, Janaisha Tolsma L on: 06/15/2015 11:11 AM   Modules accepted: Orders

## 2015-06-16 ENCOUNTER — Ambulatory Visit (INDEPENDENT_AMBULATORY_CARE_PROVIDER_SITE_OTHER): Payer: Commercial Managed Care - HMO | Admitting: Obstetrics & Gynecology

## 2015-06-16 ENCOUNTER — Ambulatory Visit (INDEPENDENT_AMBULATORY_CARE_PROVIDER_SITE_OTHER): Payer: Commercial Managed Care - HMO

## 2015-06-16 ENCOUNTER — Other Ambulatory Visit: Payer: Self-pay | Admitting: Obstetrics & Gynecology

## 2015-06-16 DIAGNOSIS — N939 Abnormal uterine and vaginal bleeding, unspecified: Secondary | ICD-10-CM | POA: Diagnosis not present

## 2015-06-16 NOTE — Progress Notes (Signed)
44 y.o. G0 Singlefemale here for a pelvic ultrasound with sonohystogram due to hx of menorrhagia.  Pt has hx of endometrial biopsy performed 07/28/08 showing:  ENDOMETRIUM, BIOPSY: - SCANTY FRAGMENTS OF ENDOMETRIAL TISSUE WITH SLIGHT CYTOLOGIC ATYPIA. - BENIGN ENDOCERVICAL GLANDS AND LOWER UTERINE SEGMENT, NO EVIDENCE OF DYSPLASIA.  This was followed by a D&C 08/23/08 showing: ENDOMETRIAL TISSUE WITH PAPILLARY CELL GROUPS WITH FEATURES CONSISTENT WITH PAPILLARY SYNCYTIAL METAPLASIA. BENIGN ENDOCERVICAL MUCOSA.  Then a repeat biopsy was done 01/03/09 showing: SCANTY SUPERFICIAL FRAGMENTS OF ENDOMETRIAL TISSUE (LIMITED MATERIAL).  Pt has not had follow up since that time with gynecology and has been on Aygestin prescribed by her PCP.  Patient's last menstrual period was 05/19/2015 (exact date).  Contraception: abstinence.    FINDINGS: Uterus: 6.1 x 4.2 x 3.2 Endometrium: 2.928mm Adnexa:  Left: 1.7 x 1.3 x 0.6cm     Right: 1.2 x 1.2 x 0.6cm Cul de sac: no free fluid   Findings discussed with pt.  Since this appears so normal, no sonohysterography was performed.  However, I do feel a repeat endometrial biopsy is prudent given the last pathology specimen showed scant tissue sampling.  (This was not done in our office).    Endometrial biopsy recommended. Verbal and written consent obtained.  Speculum placed.  Cervix visualized and cleansed with betadine prep.  A single toothed tenaculum was applied to the anterior lip of the cervix.  Endometrial pipelle was advanced through the cervix into the endometrial cavity without difficulty.  Pipelle passed to 6.5cm.  Suction applied and pipelle removed with good tissue sample obtained.  Tenculum removed.  No bleeding noted.  Patient tolerated procedure well.  All instruments removed.  Assessment:  H/o menorrhagia with normal ultrasound today.  H/O pathology results that were not exactly "normal" in 2010. Type 2 DM Morbid obesity  Plan:  Endometrial  biopsy pending.  Results will be called to pt.  Currently on Aygestin.  ~15 minutes spent with patient >50% of time was in face to face discussion of above.

## 2015-06-22 ENCOUNTER — Encounter: Payer: Self-pay | Admitting: Obstetrics & Gynecology

## 2015-06-22 ENCOUNTER — Ambulatory Visit: Payer: Self-pay

## 2015-06-27 ENCOUNTER — Other Ambulatory Visit: Payer: Self-pay | Admitting: Obstetrics & Gynecology

## 2015-06-27 ENCOUNTER — Telehealth: Payer: Self-pay | Admitting: Obstetrics & Gynecology

## 2015-06-27 ENCOUNTER — Telehealth: Payer: Self-pay | Admitting: *Deleted

## 2015-06-27 NOTE — Telephone Encounter (Signed)
Call to patient, left message to call back. 

## 2015-06-27 NOTE — Telephone Encounter (Signed)
See result note regarding recommendation for Hysteroscopy/D&C. Surgery scheduled for 07[facility]04-15 at [facility]. Surgery instruction sheet reviewed and printed copy will be provided at appointment with Dr Hyacinth MeekerMiller in office tomorrow.

## 2015-06-27 NOTE — Telephone Encounter (Signed)
Patient returned call. Questions answered and patient agreeable to proceed. Appointment with Dr Hyacinth MeekerMiller tomorrow to discuss results and plan for surgery.  Routing to provider for final review. Patient agreeable to disposition. Will close encounter.

## 2015-06-27 NOTE — Telephone Encounter (Signed)
Spoke with patient regarding benefit for outpatient surgery. Patient understood benefit explanation and payment options, but has concerns and requests to reschedule surgery. Patient aware estimated professional benefit only. Patient aware hospital facility will contact separately with benefit. Routing to [employer] Yeakley for review. Patient agreeable to return call from [employer] to advise.

## 2015-06-27 NOTE — Telephone Encounter (Signed)
Call back to patient. Advised surgery time is 0730, arrive 0600.  Routing to provider for final review. Patient agreeable to disposition. Will close encounter.

## 2015-06-28 ENCOUNTER — Ambulatory Visit (INDEPENDENT_AMBULATORY_CARE_PROVIDER_SITE_OTHER): Payer: Commercial Managed Care - HMO | Admitting: Obstetrics & Gynecology

## 2015-06-28 VITALS — BP 118/82 | HR 68 | Resp 16 | Wt 327.0 lb

## 2015-06-28 DIAGNOSIS — N8502 Endometrial intraepithelial neoplasia [EIN]: Secondary | ICD-10-CM | POA: Diagnosis not present

## 2015-06-28 DIAGNOSIS — E669 Obesity, unspecified: Secondary | ICD-10-CM | POA: Diagnosis not present

## 2015-06-28 DIAGNOSIS — Z8742 Personal history of other diseases of the female genital tract: Secondary | ICD-10-CM

## 2015-06-28 MED ORDER — HYDROCODONE-ACETAMINOPHEN 5-325 MG PO TABS: 1.0000 | ORAL_TABLET | Freq: Four times a day (QID) | ORAL | Status: DC | PRN

## 2015-06-28 NOTE — Progress Notes (Signed)
44 y.o. G0P0 Single African American female here for discussion of upcoming procedure.  Hysteroscopy with D&C planned due to abnormal endometrial biopsy showing atypical cells.  Specimen was scant so additional sampling was recommended.  Pap was normal in November before biopsy was obtained.    Pt has hx of abnormal endometrial biopsy done 12/09 showign scanty fragments of endometrial tissues with slight atypia.  This was obtained due to menorrhagia.  Follow up D&X was fine.  Repeat biopsy 6/10 was normal as well.  Pt was on aygestin.  She did not follow up with me after that time.  Her PCP has been writing the rx for Aygestin for her since then and finally pushed her to come back for follow up.  She, bascially, doesn't bleed or have a cycle due to the aygestin.    Pt is aware of findings and recommendations.  Procedure discussed with patient.  Recovery and pain management discussed.  Risks discussed including but not limited to bleeding, rare risk of transfusion, infection, 1% risk of uterine perforation with risks of fluid deficit causing cardiac arrythmia, cerebral swelling and/or need to stop procedure early.  Fluid emboli and rare risk of death discussed.  DVT/PE, rare risk of risk of bowel/bladder/ureteral/vascular injury.  Patient aware if pathology abnormal she may need additional treatment.  All questions answered.    Pt would also like to talk about her weight and possible weight loss options.  She is aware I feel this is contributing to these abnormal biopsies.  Pt and I reviewed programs here in [place] and outside local area.  Information regarding [facility] and [place] given.  Information session information given.  Pt will consider.  All questions answered.    Ob Hx:   Patient's last menstrual period was 05/19/2015 (exact date).          Sexually active: No. Birth control: abstinence Last pap: Last MMG: Tobacco:  Past Surgical History  Procedure Laterality Date  . Dilation and  curettage of uterus  08/23/2008    papillary synctial metaplasia    Past Medical History  Diagnosis Date  . Diabetes mellitus   . Abnormal uterine bleeding     Allergies: Review of patient's allergies indicates no known allergies.  Current Outpatient Prescriptions  Medication Sig Dispense Refill  . Ascorbic Acid (VITAMIN C PO) Take by mouth daily.    Marland Kitchen BIOTIN PO Take by mouth daily.    . Cholecalciferol (VITAMIN D3) 5000 UNITS CAPS Take 1 capsule by mouth daily.    . CHROMIUM PO Take by mouth daily.    . meloxicam (MOBIC) 15 MG tablet Take 1 tablet by mouth daily as needed.  2  . metFORMIN (GLUCOPHAGE) 500 MG tablet Take by mouth 2 (two) times daily with a meal.    . MILK THISTLE PO Take by mouth daily.    . Multiple Vitamins-Minerals (ZINC PO) Take by mouth daily.    . NON FORMULARY R-lipoic acid    . norethindrone (AYGESTIN) 5 MG tablet Take 1 tablet (5 mg total) by mouth daily. 30 tablet 0  . RESVERATROL PO Take by mouth daily.    Marland Kitchen HYDROcodone-acetaminophen (NORCO/VICODIN) 5-325 MG tablet Take 1-2 tablets by mouth every 6 (six) hours as needed for moderate pain or severe pain. 12 tablet 0   No current facility-administered medications for this visit.    ROS: Pertinent items noted in HPI and remainder of comprehensive ROS otherwise negative.  Exam:    BP 118/82 mmHg  Pulse 68  Resp 16  Wt 327 lb (148.326 kg)  LMP 05/19/2015 (Exact Date)  General appearance: alert and cooperative Head: Normocephalic, without obvious abnormality, atraumatic Neck: no adenopathy, supple, symmetrical, trachea midline and thyroid not enlarged, symmetric, no tenderness/mass/nodules Lungs: clear to auscultation bilaterally Heart: regular rate and rhythm, S1, S2 normal, no murmur, click, rub or gallop Abdomen: soft, non-tender; bowel sounds normal; no masses,  no organomegaly Extremities: extremities normal, atraumatic, no cyanosis or edema Skin: Skin color, texture, turgor normal. No rashes or  lesions Lymph nodes: Cervical, supraclavicular, and axillary nodes normal. no inguinal nodes palpated Neurologic: Grossly normal  Pelvic: deferred at [facility] and exam with endometrial biopsy was done 06/28/15.  A: H/O menorrhagia with current amenorrhea due to aygestin use  Abnormal endometrial biopsy  Morbid obesity  P:  Hysteroscopy with D&C planned Rx for Vicodin given. Medications/Vitamins reviewed.  Pt knows needs to stop products containing ASA. Post surgical brochure given for pre and post op instructions. Information on weight loss programs given.  ~30 minutes spent with patient >50% of time was in face to face discussion of above.

## 2015-06-29 NOTE — Patient Instructions (Signed)
Your procedure is scheduled on:  Monday, DEC. 5, 2016  Enter through the Main Entrance of [facility] at:  6:00 A.M.  Pick up the phone at the desk and dial [phone].  Call this number if you have problems the morning of surgery: [phone].  Remember: Do NOT eat food or drink after:  MIDNIGHT SUNDAY Take these medicines the morning of surgery with a SIP OF WATER: NONE  *DO NOT TAKE METFORMIN 24 HOURS PRIOR TO SURGERY  Do NOT wear jewelry (body piercing), metal hair clips/bobby pins, make-up, or nail polish. Do NOT wear lotions, powders, or perfumes.  You may wear deoderant. Do NOT shave for 48 hours prior to surgery. Do NOT bring valuables to the hospital. Contacts, dentures, or bridgework may not be worn into surgery. Have a responsible adult drive you home and stay with you for 24 hours after your procedure

## 2015-06-30 ENCOUNTER — Encounter: Payer: Self-pay | Admitting: Obstetrics & Gynecology

## 2015-06-30 ENCOUNTER — Encounter (HOSPITAL_COMMUNITY): Payer: Self-pay

## 2015-06-30 ENCOUNTER — Other Ambulatory Visit: Payer: Self-pay | Admitting: Obstetrics & Gynecology

## 2015-06-30 ENCOUNTER — Other Ambulatory Visit: Payer: Self-pay

## 2015-06-30 ENCOUNTER — Encounter (HOSPITAL_COMMUNITY)
Admission: RE | Admit: 2015-06-30 | Discharge: 2015-06-30 | Disposition: A | Discharge status: Home / Self Care | Payer: Commercial Managed Care - HMO | Source: Ambulatory Visit | Attending: Obstetrics & Gynecology | Admitting: Obstetrics & Gynecology

## 2015-06-30 DIAGNOSIS — Z01818 Encounter for other preprocedural examination: Secondary | ICD-10-CM | POA: Diagnosis not present

## 2015-06-30 DIAGNOSIS — N939 Abnormal uterine and vaginal bleeding, unspecified: Secondary | ICD-10-CM | POA: Diagnosis not present

## 2015-06-30 HISTORY — DX: Unspecified osteoarthritis, unspecified site: M19.90

## 2015-06-30 LAB — BASIC METABOLIC PANEL
Anion gap: 8 (ref 5–15)
BUN: 10 mg/dL (ref 6–20)
CHLORIDE: 104 mmol/L (ref 101–111)
CO2: 28 mmol/L (ref 22–32)
CREATININE: 0.6 mg/dL (ref 0.44–1.00)
Calcium: 9.1 mg/dL (ref 8.9–10.3)
GFR calc Af Amer: >60 mL/min (ref >60)
GLUCOSE: 121 mg/dL — AB (ref 65–99)
Potassium: 4 mmol/L (ref 3.5–5.1)
SODIUM: 140 mmol/L (ref 135–145)

## 2015-06-30 LAB — CBC
HCT: 39.2 % (ref 36.0–46.0)
Hemoglobin: 13 g/dL (ref 12.0–15.0)
MCH: 30.6 pg (ref 26.0–34.0)
MCHC: 33.2 g/dL (ref 30.0–36.0)
MCV: 92.2 fL (ref 78.0–100.0)
PLATELETS: 382 10*3/uL (ref 150–400)
RBC: 4.25 MIL/uL (ref 3.87–5.11)
RDW: 12.8 % (ref 11.5–15.5)
WBC: 8.9 10*3/uL (ref 4.0–10.5)

## 2015-07-02 ENCOUNTER — Encounter (HOSPITAL_COMMUNITY): Payer: Self-pay | Admitting: Anesthesiology

## 2015-07-02 NOTE — Anesthesia Preprocedure Evaluation (Addendum)
Anesthesia Evaluation  Patient identified by MRN, date of birth, ID band Patient awake    Reviewed: Allergy & Precautions, NPO status , Patient's Chart, lab work & pertinent test results  Airway Mallampati: III  TM Distance: >3 FB Neck ROM: Full    Dental no notable dental hx. (+) Teeth Intact   Pulmonary neg pulmonary ROS,    Pulmonary exam normal breath sounds clear to auscultation       Cardiovascular negative cardio ROS Normal cardiovascular exam Rhythm:Regular Rate:Normal     Neuro/Psych PSYCHIATRIC DISORDERS negative neurological ROS     GI/Hepatic negative GI ROS, Neg liver ROS,   Endo/Other  diabetes, Well Controlled, Type 2, Oral Hypoglycemic AgentsMorbid obesity  Renal/GU negative Renal ROS  negative genitourinary   Musculoskeletal  (+) Arthritis , Osteoarthritis,    Abdominal (+) + obese,   Peds  Hematology   Anesthesia Other Findings   Reproductive/Obstetrics AUB Atypical cells on endometrial biopsy                            Anesthesia Physical Anesthesia Plan  ASA: III  Anesthesia Plan: General   Post-op Pain Management:    Induction: Intravenous  Airway Management Planned: LMA  Additional Equipment:   Intra-op Plan:   Post-operative Plan: Extubation in OR  Informed Consent: I have reviewed the patients History and Physical, chart, labs and discussed the procedure including the risks, benefits and alternatives for the proposed anesthesia with the patient or authorized representative who has indicated his/her understanding and acceptance.   Dental advisory given  Plan Discussed with: Anesthesiologist, CRNA and Surgeon  Anesthesia Plan Comments:         Anesthesia Quick Evaluation

## 2015-07-04 ENCOUNTER — Ambulatory Visit (HOSPITAL_COMMUNITY): Payer: Commercial Managed Care - HMO | Admitting: Anesthesiology

## 2015-07-04 ENCOUNTER — Ambulatory Visit (HOSPITAL_COMMUNITY)
Admission: RE | Admit: 2015-07-04 | Discharge: 2015-07-04 | Disposition: A | Discharge status: Home / Self Care | Payer: Commercial Managed Care - HMO | Source: Ambulatory Visit | Attending: Obstetrics & Gynecology | Admitting: Obstetrics & Gynecology

## 2015-07-04 ENCOUNTER — Encounter (HOSPITAL_COMMUNITY)
Admission: RE | Disposition: A | Discharge status: Home / Self Care | Payer: Self-pay | Source: Ambulatory Visit | Attending: Obstetrics & Gynecology

## 2015-07-04 ENCOUNTER — Encounter (HOSPITAL_COMMUNITY): Payer: Self-pay | Admitting: *Deleted

## 2015-07-04 DIAGNOSIS — N939 Abnormal uterine and vaginal bleeding, unspecified: Secondary | ICD-10-CM | POA: Insufficient documentation

## 2015-07-04 DIAGNOSIS — Z6841 Body Mass Index (BMI) 40.0 and over, adult: Secondary | ICD-10-CM | POA: Insufficient documentation

## 2015-07-04 DIAGNOSIS — N8502 Endometrial intraepithelial neoplasia [EIN]: Secondary | ICD-10-CM | POA: Diagnosis not present

## 2015-07-04 DIAGNOSIS — E669 Obesity, unspecified: Secondary | ICD-10-CM | POA: Diagnosis not present

## 2015-07-04 DIAGNOSIS — N938 Other specified abnormal uterine and vaginal bleeding: Secondary | ICD-10-CM | POA: Diagnosis not present

## 2015-07-04 HISTORY — PX: HYSTEROSCOPY W/D&C: SHX1775

## 2015-07-04 LAB — GLUCOSE, CAPILLARY
Glucose-Capillary: 110 mg/dL — ABNORMAL HIGH (ref 65–99)
Glucose-Capillary: 129 mg/dL — ABNORMAL HIGH (ref 65–99)

## 2015-07-04 LAB — PREGNANCY, URINE: PREG TEST UR: NEGATIVE

## 2015-07-04 SURGERY — DILATATION AND CURETTAGE /HYSTEROSCOPY
Anesthesia: General | Site: Vagina

## 2015-07-04 MED ORDER — LIDOCAINE-EPINEPHRINE 1 %-1:100000 IJ SOLN
INTRAMUSCULAR | Status: DC | PRN
  Administered 2015-07-04: 10 mL

## 2015-07-04 MED ORDER — LIDOCAINE HCL (CARDIAC) 20 MG/ML IV SOLN
INTRAVENOUS | Status: DC | PRN
  Administered 2015-07-04 (×2): 50 mg via INTRAVENOUS

## 2015-07-04 MED ORDER — SCOPOLAMINE 1 MG/3DAYS TD PT72
1.0000 | MEDICATED_PATCH | Freq: Once | TRANSDERMAL | Status: DC
  Administered 2015-07-04: 1.5 mg via TRANSDERMAL

## 2015-07-04 MED ORDER — FENTANYL CITRATE (PF) 100 MCG/2ML IJ SOLN
INTRAMUSCULAR | Status: AC
  Filled 2015-07-04: qty 2

## 2015-07-04 MED ORDER — MEPERIDINE HCL 25 MG/ML IJ SOLN: 6.2500 mg | INTRAMUSCULAR | Status: DC | PRN

## 2015-07-04 MED ORDER — LACTATED RINGERS IV SOLN
INTRAVENOUS | Status: DC
  Administered 2015-07-04: 07:00:00 via INTRAVENOUS

## 2015-07-04 MED ORDER — PROPOFOL 10 MG/ML IV BOLUS
INTRAVENOUS | Status: DC | PRN
  Administered 2015-07-04: 200 mg via INTRAVENOUS

## 2015-07-04 MED ORDER — LIDOCAINE-EPINEPHRINE 1 %-1:100000 IJ SOLN
INTRAMUSCULAR | Status: AC
  Filled 2015-07-04: qty 1

## 2015-07-04 MED ORDER — KETOROLAC TROMETHAMINE 30 MG/ML IJ SOLN
INTRAMUSCULAR | Status: DC | PRN
  Administered 2015-07-04: 30 mg via INTRAMUSCULAR
  Administered 2015-07-04: 30 mg via INTRAVENOUS

## 2015-07-04 MED ORDER — FENTANYL CITRATE (PF) 100 MCG/2ML IJ SOLN
INTRAMUSCULAR | Status: DC | PRN
  Administered 2015-07-04: 100 ug via INTRAVENOUS

## 2015-07-04 MED ORDER — PROPOFOL 10 MG/ML IV BOLUS
INTRAVENOUS | Status: AC
  Filled 2015-07-04: qty 20

## 2015-07-04 MED ORDER — METOCLOPRAMIDE HCL 5 MG/ML IJ SOLN: 10.0000 mg | Freq: Once | INTRAMUSCULAR | Status: DC | PRN

## 2015-07-04 MED ORDER — METOCLOPRAMIDE HCL 5 MG/ML IJ SOLN
INTRAMUSCULAR | Status: AC
  Filled 2015-07-04: qty 2

## 2015-07-04 MED ORDER — SCOPOLAMINE 1 MG/3DAYS TD PT72
MEDICATED_PATCH | TRANSDERMAL | Status: AC
  Administered 2015-07-04: 1.5 mg via TRANSDERMAL
  Filled 2015-07-04: qty 1

## 2015-07-04 MED ORDER — METOCLOPRAMIDE HCL 5 MG/ML IJ SOLN
INTRAMUSCULAR | Status: DC | PRN
  Administered 2015-07-04: 10 mg via INTRAVENOUS

## 2015-07-04 MED ORDER — FENTANYL CITRATE (PF) 100 MCG/2ML IJ SOLN: 25.0000 ug | INTRAMUSCULAR | Status: DC | PRN

## 2015-07-04 MED ORDER — ONDANSETRON HCL 4 MG/2ML IJ SOLN: 4.0000 mg | Freq: Once | INTRAMUSCULAR | Status: DC | PRN

## 2015-07-04 MED ORDER — MIDAZOLAM HCL 2 MG/2ML IJ SOLN
INTRAMUSCULAR | Status: AC
Start: 2015-07-04 — End: 2015-07-04
  Filled 2015-07-04: qty 2

## 2015-07-04 MED ORDER — LIDOCAINE HCL (CARDIAC) 20 MG/ML IV SOLN
INTRAVENOUS | Status: AC
  Filled 2015-07-04: qty 5

## 2015-07-04 MED ORDER — ONDANSETRON HCL 4 MG/2ML IJ SOLN
INTRAMUSCULAR | Status: AC
  Filled 2015-07-04: qty 2

## 2015-07-04 MED ORDER — MIDAZOLAM HCL 5 MG/5ML IJ SOLN
INTRAMUSCULAR | Status: DC | PRN
  Administered 2015-07-04 (×2): 1 mg via INTRAVENOUS

## 2015-07-04 MED ORDER — ONDANSETRON HCL 4 MG/2ML IJ SOLN
INTRAMUSCULAR | Status: DC | PRN
  Administered 2015-07-04: 4 mg via INTRAVENOUS

## 2015-07-04 MED ORDER — SODIUM CHLORIDE 0.9 % IR SOLN
Status: DC | PRN
  Administered 2015-07-04: 3000 mL

## 2015-07-04 SURGICAL SUPPLY — 18 items
CANISTER SUCT 3000ML (MISCELLANEOUS) ×3 IMPLANT
CATH ROBINSON RED A/P 16FR (CATHETERS) ×3 IMPLANT
CLOTH BEACON ORANGE TIMEOUT ST (SAFETY) ×3 IMPLANT
CONTAINER PREFILL 10% NBF 60ML (FORM) ×6 IMPLANT
DILATOR CANAL MILEX (MISCELLANEOUS) IMPLANT
ELECT REM PT RETURN 9FT ADLT (ELECTROSURGICAL)
ELECTRODE REM PT RTRN 9FT ADLT (ELECTROSURGICAL) IMPLANT
GLOVE BIOGEL PI IND STRL 7.0 (GLOVE) ×2 IMPLANT
GLOVE BIOGEL PI INDICATOR 7.0 (GLOVE) ×4
GLOVE ECLIPSE 6.5 STRL STRAW (GLOVE) ×6 IMPLANT
GOWN STRL REUS W/TWL LRG LVL3 (GOWN DISPOSABLE) ×6 IMPLANT
LOOP ANGLED CUTTING 22FR (CUTTING LOOP) IMPLANT
PACK VAGINAL MINOR WOMEN LF (CUSTOM PROCEDURE TRAY) ×3 IMPLANT
PAD OB MATERNITY 4.3X12.25 (PERSONAL CARE ITEMS) ×3 IMPLANT
TOWEL OR 17X24 6PK STRL BLUE (TOWEL DISPOSABLE) ×6 IMPLANT
TUBING AQUILEX INFLOW (TUBING) ×3 IMPLANT
TUBING AQUILEX OUTFLOW (TUBING) ×3 IMPLANT
WATER STERILE IRR 1000ML POUR (IV SOLUTION) ×3 IMPLANT

## 2015-07-04 NOTE — H&P (Signed)
Aimee Moore is an 44 y.o. female  G0 SAAF here for hysteroscopy, D&C and ECC due to atypical cells obtained with office endometrial biopsy.  Pt has hx of abnormal biopsy in 2009, was lost to follow-up, and has morbid obesity.  She is here for additional evaluation.  Risks and benefits have been discussed and are documented in the office note from 06/28/15.  There are no other alternatives to this more definitive evaluation.  Pertinent Gynecological History: Menses: minimal bleeding due to aygestin Bleeding: minimal Contraception: abstinence DES exposure: denies Blood transfusions: none Sexually transmitted diseases: no past history Previous GYN Procedures: D&C  Last mammogram: pt has never done one Date: n/a Last pap: normal Date: 11/16 OB History: G0, P0   Menstrual History: Patient's last menstrual period was 05/19/2015 (exact date).    Past Medical History  Diagnosis Date  . Diabetes mellitus   . Abnormal uterine bleeding   . Arthritis     Left knee    Past Surgical History  Procedure Laterality Date  . Dilation and curettage of uterus  08/23/2008    papillary synctial metaplasia    Family History  Problem Relation Age of Onset  . Hypertension Mother   . Lupus Mother   . Diabetes Father   . Hypertension Sister   . Cancer Maternal Aunt     ovarian or uterine?  . Diabetes Paternal Aunt   . Diabetes Paternal Uncle   . Cancer Maternal Grandfather     prostate  . Diabetes Paternal Grandmother     Social History:  reports that she has never smoked. She has never used smokeless tobacco. She reports that she does not drink alcohol or use illicit drugs.  Allergies: No Known Allergies  Prescriptions prior to admission  Medication Sig Dispense Refill Last Dose  . Ascorbic Acid (VITAMIN C PO) Take 2 capsules by mouth daily.    Taking  . BIOTIN PO Take 1 capsule by mouth daily.    Taking  . Cholecalciferol (VITAMIN D3) 5000 UNITS CAPS Take 1 capsule by mouth daily.    Taking  . CHROMIUM PO Take 1 capsule by mouth 2 (two) times daily.    Taking  . HYDROcodone-acetaminophen (NORCO/VICODIN) 5-325 MG tablet Take 1-2 tablets by mouth every 6 (six) hours as needed for moderate pain or severe pain. 12 tablet 0   . meloxicam (MOBIC) 15 MG tablet Take 1 tablet by mouth daily as needed for pain.   2 Taking  . metFORMIN (GLUCOPHAGE) 500 MG tablet Take by mouth 2 (two) times daily with a meal.   07/02/2015 at 1800  . MILK THISTLE PO Take 1 capsule by mouth 2 (two) times daily.    Taking  . Multiple Vitamins-Minerals (ZINC PO) Take 1 capsule by mouth daily.    Taking  . NON FORMULARY Take 1 capsule by mouth 2 (two) times daily. R-lipoic acid   Taking  . norethindrone (AYGESTIN) 5 MG tablet Take 1 tablet (5 mg total) by mouth daily. 30 tablet 0 Taking  . RESVERATROL PO Take 1 capsule by mouth 2 (two) times daily.    Taking    Review of Systems  All other systems reviewed and are negative.   Blood pressure 144/82, pulse 93, temperature 98.6 F (37 C), temperature source Oral, resp. rate 20, last menstrual period 05/19/2015, SpO2 98 %. Physical Exam  Constitutional: She is oriented to person, place, and time. She appears well-developed and well-nourished.  Cardiovascular: Normal rate and regular rhythm.  Respiratory: Effort normal and breath sounds normal.  Neurological: She is alert and oriented to person, place, and time.  Skin: Skin is warm and dry.  Psychiatric: She has a normal mood and affect.    Results for orders placed or performed during the hospital encounter of 07/04/15 (from the past 24 hour(s))  Pregnancy, urine     Status: None   Collection Time: 07/04/15  6:00 AM  Result Value Ref Range   Preg Test, Ur NEGATIVE NEGATIVE  Glucose, capillary     Status: Abnormal   Collection Time: 07/04/15  6:13 AM  Result Value Ref Range   Glucose-Capillary 110 (H) 65 - 99 mg/dL    No results found.  Assessment/Plan: 44 yo G0P0 here for hysteroscopy, ECC  and D&C due to atypical endometrial cells on office endometrial biopsy.  Questions answered.  Pt ready to proceed.  Valentina ShaggyMILLER, Aimee Moore Aimee Moore 07/04/2015, 7:16 AM

## 2015-07-04 NOTE — Anesthesia Procedure Notes (Signed)
Procedure Name: LMA Insertion Date/Time: 07/04/2015 7:33 AM Performed by: Janeece AgeeWRAPE, Madalena W Pre-anesthesia Checklist: Patient identified, Emergency Drugs available, Suction available, Patient being monitored and Timeout performed Patient Re-evaluated:Patient Re-evaluated prior to inductionOxygen Delivery Method: Circle system utilized Preoxygenation: Pre-oxygenation with 100% oxygen Intubation Type: IV induction LMA: LMA inserted LMA Size: 4.0 Grade View: Grade III Number of attempts: 1 Airway Equipment and Method: Patient positioned with wedge pillow Placement Confirmation: positive ETCO2 and breath sounds checked- equal and bilateral

## 2015-07-04 NOTE — Discharge Instructions (Signed)
Post-surgical Instructions, Outpatient Surgery ° °You may expect to feel dizzy, weak, and drowsy for as long as 24 hours after receiving the medicine that made you sleep (anesthetic). For the first 24 hours after your surgery:   °· Do not drive a car, ride a bicycle, participate in physical activities, or take public transportation until you are done taking narcotic pain medicines or as directed by Dr. Aldene Hendon.  °· Do not drink alcohol or take tranquilizers.  °· Do not take medicine that has not been prescribed by your physicians.  °· Do not sign important papers or make important decisions while on narcotic pain medicines.  °· Have a responsible person with you.  ° °PAIN MANAGEMENT °· Motrin 800mg.  (This is the same as 4-200mg over the counter tablets of Motrin or ibuprofen.)  You may take this every eight hours or as needed for cramping.   °· Vicodin 5/500mg.  For more severe pain, take one or two tablets every four to six hours as needed for pain control.  (Remember that narcotic pain medications increase your risk of constipation.  If this becomes a problem, you may take an over the counter stool softener like Colace 100mg up to four times a day.) ° °DO'S AND DON'T'S °· Do not take a tub bath for one week.  You may shower on the first day after your surgery °· Do not do any heavy lifting for one to two weeks.  This increases the chance of bleeding. °· Do move around as you feel able.  Stairs are fine.  You may begin to exercise again as you feel able.  Do not lift any weights for two weeks. °· Do not put anything in the vagina for two weeks--no tampons, intercourse, or douching.   ° °REGULAR MEDIATIONS/VITAMINS: °· You may restart all of your regular medications as prescribed. °· You may restart all of your vitamins as you normally take them.   ° °PLEASE CALL OR SEEK MEDICAL CARE IF: °· You have persistent nausea and vomiting.  °· You have trouble eating or drinking.  °· You have an oral temperature above 100.5.   °· You have constipation that is not helped by adjusting diet or increasing fluid intake. Pain medicines are a common cause of constipation.  °· You have heavy vaginal bleeding °· You have redness or drainage from your incision(s) or there is increasing pain or tenderness near or in the surgical site.  ° ° ° °

## 2015-07-04 NOTE — Transfer of Care (Signed)
Immediate Anesthesia Transfer of Care Note  Patient: Aimee Moore  Procedure(s) Performed: Procedure(s): DILATATION AND CURETTAGE /HYSTEROSCOPY (N/A)  Patient Location: PACU  Anesthesia Type:General  Level of Consciousness: awake, alert  and oriented  Airway & Oxygen Therapy: Patient Spontanous Breathing and Patient connected to nasal cannula oxygen  Post-op Assessment: Report given to RN and Post -op Vital signs reviewed and stable  Post vital signs: Reviewed and stable  Last Vitals:  Filed Vitals:   07/04/15 0608 07/04/15 0814  BP: 144/82   Pulse: 93 87  Temp: 37 C 37.1 C  Resp: 20 20    Complications: No apparent anesthesia complications

## 2015-07-04 NOTE — Anesthesia Postprocedure Evaluation (Signed)
Anesthesia Post Note  Patient: Aimee LukesCynthia Moore  Procedure(s) Performed: Procedure(s) (LRB): DILATATION AND CURETTAGE /HYSTEROSCOPY (N/A)  Patient location during evaluation: PACU Anesthesia Type: General Level of consciousness: awake and alert and oriented Pain management: pain level controlled Vital Signs Assessment: post-procedure vital signs reviewed and stable Respiratory status: spontaneous breathing, nonlabored ventilation and respiratory function stable Cardiovascular status: blood pressure returned to baseline and stable Postop Assessment: no signs of nausea or vomiting Anesthetic complications: no    Last Vitals:  Filed Vitals:   07/04/15 0845 07/04/15 0900  BP: 113/71 111/73  Pulse: 78 79  Temp:    Resp: 16 21    Last Pain: There were no vitals filed for this visit.               Kassandra Meriweather A.

## 2015-07-04 NOTE — Addendum Note (Signed)
Addendum  created 07/04/15 [id number] by Mal AmabileMichael Charlyne Robertshaw, MD   Modules edited: PRL Based Order Sets

## 2015-07-04 NOTE — Op Note (Signed)
07/04/2015  8:45 AM  PATIENT:  Aimee Moore  [age] y.o. female  PRE-OPERATIVE DIAGNOSIS:  Abnormal Uterine Bleeding, atypical endometrial cells on endometrial biopsy, morbid obesity  POST-OPERATIVE DIAGNOSIS:  Abnormal Uterine Bleeding, atypical endometrial cells on endometrial biopsy, morbid obesity  PROCEDURE:  Procedure(s): DILATATION AND CURETTAGE /HYSTEROSCOPY  SURGEON:  Maddock Finigan SUZANNE  ASSISTANTS: OR staff   ANESTHESIA:   general  ESTIMATED BLOOD LOSS: 10cc  BLOOD ADMINISTERED:none   FLUIDS: 1000ccLR  UOP: 50cc  SPECIMEN:  ECC and endometrial curettings  DISPOSITION OF SPECIMEN:  PATHOLOGY  FINDINGS: thin endometrium  DESCRIPTION OF OPERATION: Patient was taken to the operating room.  She is placed in the supine position. SCDs were on her lower extremities and functioning properly. General anesthesia with an LMA was administered without difficulty. Dr. Cristela BlueKyle Jackson oversaw case.  Legs were then [facility] in the [facility] stirrups in the low lithotomy position. The legs were lifted to the high lithotomy position and the Betadine prep was used on the inner thighs perineum and vagina x3. Patient was draped in a normal standard fashion. An in and out catheterization with a red rubber Foley catheter was performed. Approximately 50 cc of clear urine was noted. A bivalve speculum was placed the vagina. The anterior lip of the cervix was grasped with single-tooth tenaculum.  A paracervical block of 1% lidocaine mixed one-to-one with epinephrine (1:100,000 units).  10 cc was used total. The cervix is dilated up to #[facility]. The endometrial cavity sounded to 8 cm.  ECC was obtained.   A 2.9 millimeter diagnostic hysteroscope was obtained. 1.5% glycine was used as a hysteroscopic fluid. The hysteroscope was advanced through the endocervical canal into the endometrial cavity. The tubal ostia were noted bilaterally. Photos were taken.  The hysteroscope was removed. A #1 toothed  curette was used to curette the cavity until rough gritty tissue is noted in all quadrants.  At this point no other procedure was needed. The hysteroscope was removed. The fluid deficit was 80 cc. The tenaculum was removed from the anterior lip of the cervix. The speculum was returned vagina. The prep was cleansed of the patient's skin. The legs are positioned back in the supine position. Sponge, lap, needle, initially counts were correct x2. Patient was taken to recovery in stable condition.  COUNTS:  YES  PLAN OF CARE: Transfer to PACU

## 2015-07-05 ENCOUNTER — Encounter (HOSPITAL_COMMUNITY): Payer: Self-pay | Admitting: Obstetrics & Gynecology

## 2015-07-05 ENCOUNTER — Ambulatory Visit: Payer: Commercial Managed Care - HMO | Admitting: Obstetrics & Gynecology

## 2015-07-07 ENCOUNTER — Other Ambulatory Visit: Payer: Self-pay | Admitting: Obstetrics & Gynecology

## 2015-07-07 MED ORDER — NORETHINDRONE ACETATE 5 MG PO TABS: 5.0000 mg | ORAL_TABLET | Freq: Every day | ORAL | Status: AC

## 2015-07-08 ENCOUNTER — Telehealth: Payer: Self-pay | Admitting: Obstetrics & Gynecology

## 2015-07-08 NOTE — Telephone Encounter (Signed)
1535: Spoke with patient. She states that she feels as though her clothes are fitting tighter and that her face feels like it is bloated more than normal. She states this started today along with white vaginal discharge. Patient states she has been resting but has been walking and out of bed.  Patient has not taken Vicodin, states that she did not fill prescription because she was not in pain. Using Motrin PRN for knee pain only.  Denies abdominal pain, Denies fevers, denies SOB, denies Chest pain, denies vaginal itching, denies fevers or chills, denies vaginal bleeding, denies urinary complaints. Patient reports that she is eating and drinking per normal, however, she states "I have been eating and drinking the wrong things." Reports she ate pizza with bacon and pepperoni recently and states she may be having some bloating because of that.  Patient is asked to look at her tibia and press down on tibia to assess for any indentations after pressing on area. Patient states she has done that and is not having any swelling. She states she knows what that looks like because she has had that before.  She reports she does not have a history of yeast infections. Advised patient may be physiologic (normal) discharge. Advised to return call if develops worsening discharge, vaginal itching or irritation of the skin. Advised her to not use any OTC products in vagina.   Patient offered office visit with Dr. Hyacinth MeekerMiller and declines as she is on her way to work. She states she works on a [employer] for work and feels well enough to go to work.  Advised patient will review with Dr. Hyacinth MeekerMiller and return call with instructions. Patient agreeable.   1610: Reviewed with Dr. Hyacinth MeekerMiller patient triage. Patient to monitor symptoms and give emergency precautions to patient.   1624: Call to patient, she is at work now. Advised patient reviewed with Dr. Hyacinth MeekerMiller and she is to watch for any increase in symptoms closely. Advised patient she is to  call office or seek immediate emergency care at closest ER if develops any concerning symptoms. We discussed emergency symptoms such as SOB, nausea, vomiting, swelling in any extremity especially lower legs or change in leg size, or redness or warmth of calf, change in bowel or bladder habits, and/or fevers or chills. Patient verbalized understanding of emergency symptoms and will call back or seek emergency care if any concerning symptoms or symptoms as discussed.   Routing to provider for final review. Patient agreeable to disposition. Will close encounter.

## 2015-07-08 NOTE — Telephone Encounter (Signed)
Patient just had surgery 07/04/2015 says she is bloated as if she is on steroids, and having white vaginal discharge just noticed it this morning. Best # to reach: [phone]

## 2015-07-18 ENCOUNTER — Ambulatory Visit (INDEPENDENT_AMBULATORY_CARE_PROVIDER_SITE_OTHER): Payer: Commercial Managed Care - HMO | Admitting: Obstetrics & Gynecology

## 2015-07-18 ENCOUNTER — Ambulatory Visit: Payer: Commercial Managed Care - HMO | Admitting: Obstetrics & Gynecology

## 2015-07-18 ENCOUNTER — Encounter: Payer: Self-pay | Admitting: Obstetrics & Gynecology

## 2015-07-18 VITALS — BP 130/82 | HR 84 | Resp 18 | Ht 62.0 in | Wt 332.0 lb

## 2015-07-18 DIAGNOSIS — E669 Obesity, unspecified: Secondary | ICD-10-CM

## 2015-07-18 DIAGNOSIS — N8502 Endometrial intraepithelial neoplasia [EIN]: Secondary | ICD-10-CM | POA: Diagnosis not present

## 2015-07-18 NOTE — Progress Notes (Signed)
Subjective:     Patient ID: Aimee Moore, female   DOB: 10-14-1970, 44 y.o.   MRN: [id number]  HPI 44 yo G0 SAAF here for follow up after hysteroscopy and D&C.  Doing well.  Spotted for about a week.  Denies pain.  Never took anything for pain.  Reviewed pathology which was benign.  Neg pap with neg HR HPV 11/16.    Pt does not cycle on the aygestin that she has been on for several years.  Given that she is not SA and has morbid obesity, I feel it is reasonable to stay on this.  She is clearly aware this is not used for contraception and if she contemplates SA, she needs to call and let me know so she can be switched to [place].    Review of Systems  All other systems reviewed and are negative.      Objective:   Physical Exam  Constitutional: She is oriented to person, place, and time. She appears well-developed and well-nourished.  Abdominal: Soft. Bowel sounds are normal.  Genitourinary: Vagina normal and uterus normal. There is no rash, tenderness or lesion on the right labia. There is no rash, tenderness or lesion on the left labia. Cervix exhibits no motion tenderness. Right adnexum displays no mass, no tenderness and no fullness. Left adnexum displays no mass, no tenderness and no fullness.  Lymphadenopathy:       Right: No inguinal adenopathy present.       Left: No inguinal adenopathy present.  Neurological: She is alert and oriented to person, place, and time.  Skin: Skin is warm and dry.  Psychiatric: She has a normal mood and affect.       Assessment:     44 yo G0 SAAF here for visit after Hysteroscopy with D&C, negative pathology    Plan:     Pt will stay on Aygestin.  rx has already been done.  Follow up with AEX.  Pt knows to call with any abnormal bleeding.

## 2015-07-18 NOTE — Progress Notes (Deleted)
Post Operative Visit  Procedure: DILATATION AND CURETTAGE /HYSTEROSCOPY Days Post-op: 14  Subjective: ***  Objective: BP 130/82 mmHg  Pulse 84  Resp 18  Ht 5\' 2"  (1.575 m)  Wt 332 lb (150.594 kg)  BMI 60.71 kg/m2  EXAM General: {Exam; general:16600} Resp: {Exam; lung:16931} Cardio: {Exam; heart:5510} GI: {Exam, IO:9629528}GI:3041136} Extremities: {Exam; extremity:5109} Vaginal Bleeding: {exam; vaginal bleeding:3041122}  Assessment: s/p ***  Plan: Recheck {NUMBER 1-10:22536} weeks ***

## 2015-10-31 ENCOUNTER — Encounter: Payer: Self-pay | Admitting: Obstetrics & Gynecology

## 2016-01-19 ENCOUNTER — Encounter: Payer: Self-pay | Admitting: Obstetrics & Gynecology

## 2016-06-08 ENCOUNTER — Ambulatory Visit: Payer: Commercial Managed Care - HMO | Admitting: Nurse Practitioner

## 2016-08-01 ENCOUNTER — Other Ambulatory Visit: Payer: Self-pay | Admitting: Obstetrics & Gynecology

## 2016-08-01 NOTE — Telephone Encounter (Signed)
Medication refill request: aygestin  Last AEX:  06/06/15 PG Next AEX: Pt discharged  Last MMG (if hormonal medication request):  Refill authorized: pt discharged

## 2016-09-06 ENCOUNTER — Ambulatory Visit (INDEPENDENT_AMBULATORY_CARE_PROVIDER_SITE_OTHER): Payer: Commercial Managed Care - HMO | Admitting: Physician Assistant

## 2016-09-06 ENCOUNTER — Encounter: Payer: Self-pay | Admitting: Physician Assistant

## 2016-09-06 VITALS — BP 126/78 | HR 92 | Temp 98.0°F | Ht 62.0 in | Wt 355.0 lb

## 2016-09-06 DIAGNOSIS — M545 Low back pain, unspecified: Secondary | ICD-10-CM

## 2016-09-06 DIAGNOSIS — R350 Frequency of micturition: Secondary | ICD-10-CM | POA: Diagnosis not present

## 2016-09-06 DIAGNOSIS — E669 Obesity, unspecified: Secondary | ICD-10-CM

## 2016-09-06 LAB — POCT URINALYSIS DIP (MANUAL ENTRY)
Bilirubin, UA: NEGATIVE
Glucose, UA: NEGATIVE
Ketones, POC UA: NEGATIVE
Leukocytes, UA: NEGATIVE
Nitrite, UA: NEGATIVE
Protein Ur, POC: NEGATIVE
Spec Grav, UA: 1.025
Urobilinogen, UA: 0.2
pH, UA: 6

## 2016-09-06 LAB — POC MICROSCOPIC URINALYSIS (UMFC): Mucus: ABSENT

## 2016-09-06 MED ORDER — CYCLOBENZAPRINE HCL 10 MG PO TABS: 10.0000 mg | ORAL_TABLET | Freq: Three times a day (TID) | ORAL | 0 refills | Status: DC | PRN

## 2016-09-06 MED ORDER — MELOXICAM 7.5 MG PO TABS: 7.5000 mg | ORAL_TABLET | Freq: Every day | ORAL | 1 refills | Status: DC

## 2016-09-06 NOTE — Progress Notes (Signed)
Aimee Moore  MRN: [id number] DOB: 1971-01-08  PCP: Alva Garnet., MD  Subjective:  Pt is a 46 year old female PMH obesity, diabetes who presents to clinic for muscle pain.  Pain started about four days ago.She first noticed it in the morning after she woke up. Pain is located on her left low back. Radiates out towards her left side a little bit. Denies pain/tenderness, numbness/tingling to her left lower extrimety. Twisting movements make it worse.  No MOI. No recent increase in activity/lifting of heavy things.  No history of back pain. She is taking ibuprofen.  She does not exercise. Has not tried heating pad. Does not stretch.   Endorses some increased urinary frequency recently, however thinks it may be due to her increased fluid intake.  Denies fever, chills, abdominal pain, changes in bowel habits, Burning with urination, increased urinary urgency.   Review of Systems  Constitutional: Negative for chills, diaphoresis, fatigue and fever.  HENT: Negative for congestion, postnasal drip, rhinorrhea, sinus pressure, sneezing and sore throat.   Respiratory: Negative for cough, chest tightness, shortness of breath and wheezing.   Cardiovascular: Negative for chest pain and palpitations.  Gastrointestinal: Negative for abdominal pain, diarrhea, nausea and vomiting.  Genitourinary: Positive for frequency. Negative for decreased urine volume, difficulty urinating, dysuria, enuresis, flank pain, hematuria and urgency.  Musculoskeletal: Positive for back pain. Negative for arthralgias, gait problem, neck pain and neck stiffness.  Skin: Negative.   Neurological: Negative for dizziness, weakness, light-headedness and headaches.    Patient Active Problem List   Diagnosis Date Noted  . DM type 2 causing complication (HCC) 10/19/2011  . Obesity (BMI 35.0-39.9 without comorbidity) 10/19/2011  . Confusion 10/18/2011    Current Outpatient Prescriptions on File Prior to Visit    Medication Sig Dispense Refill  . Ascorbic Acid (VITAMIN C PO) Take 2 capsules by mouth daily.     Marland Kitchen BIOTIN PO Take 1 capsule by mouth daily.     . Cholecalciferol (VITAMIN D3) 5000 UNITS CAPS Take 1 capsule by mouth daily.    . CHROMIUM PO Take 1 capsule by mouth 2 (two) times daily.     . meloxicam (MOBIC) 15 MG tablet Take 1 tablet by mouth daily as needed for pain.   2  . metFORMIN (GLUCOPHAGE) 500 MG tablet Take by mouth 2 (two) times daily with a meal.    . MILK THISTLE PO Take 1 capsule by mouth 2 (two) times daily.     . Multiple Vitamins-Minerals (ZINC PO) Take 1 capsule by mouth daily.     . NON FORMULARY Take 1 capsule by mouth 2 (two) times daily. R-lipoic acid    . norethindrone (AYGESTIN) 5 MG tablet Take 1 tablet (5 mg total) by mouth daily. 30 tablet 13  . RESVERATROL PO Take 1 capsule by mouth 2 (two) times daily.      No current facility-administered medications on file prior to visit.     No Known Allergies   Objective:  BP 126/78   Pulse 92   Temp 98 F (36.7 C) (Oral)   Ht 5\' 2"  (1.575 m)   Wt (!) 355 lb (161 kg)   SpO2 93%   BMI 64.93 kg/m   Physical Exam  Constitutional: She is oriented to person, place, and time and well-developed, well-nourished, and in no distress. No distress.  Cardiovascular: Normal rate, regular rhythm and normal heart sounds.   Abdominal: There is no CVA tenderness.  Musculoskeletal:  Lumbar back: She exhibits tenderness (mild left lower torso. ). She exhibits no bony tenderness, no deformity, no laceration and no spasm.  Neurological: She is alert and oriented to person, place, and time. She has normal motor skills, normal sensation and normal strength. She has a normal Straight Leg Raise Test. Gait normal. GCS score is 15.  Skin: Skin is warm and dry. No rash noted.  Psychiatric: Mood, memory, affect and judgment normal.  Vitals reviewed.  Results for orders placed or performed in visit on 09/06/16  POCT urinalysis  dipstick  Result Value Ref Range   Color, UA yellow yellow   Clarity, UA clear clear   Glucose, UA negative negative   Bilirubin, UA negative negative   Ketones, POC UA negative negative   Spec Grav, UA 1.025    Blood, UA trace-lysed (A) negative   pH, UA 6.0    Protein Ur, POC negative negative   Urobilinogen, UA 0.2    Nitrite, UA Negative Negative   Leukocytes, UA Negative Negative  POCT Microscopic Urinalysis ([facility])  Result Value Ref Range   WBC,UR,HPF,POC None None WBC/hpf   RBC,UR,HPF,POC Few (A) None RBC/hpf   Bacteria Few (A) None, Too numerous to count   Mucus Absent Absent   Epithelial Cells, UR Per Microscopy Many (A) None, Too numerous to count cells/hpf    Assessment and Plan :  1. Acute left-sided low back pain without sciatica 2. Increased frequency of urination 3. Obesity (BMI 35.0-39.9 without comorbidity) - cyclobenzaprine (FLEXERIL) 10 MG tablet; Take 1 tablet (10 mg total) by mouth 3 (three) times daily as needed for muscle spasms.  Dispense: 30 tablet; Refill: 0 - meloxicam (MOBIC) 7.5 MG tablet; Take 1 tablet (7.5 mg total) by mouth daily. Max dose 15mg .  Dispense: 30 tablet; Refill: 1 - Urine culture - POCT urinalysis dipstick - POCT Microscopic Urinalysis ([facility]) - Labs do not indicate urinary tract infection. Will treat for lumbago. Stretches, ice/heat, exercise discussed. RTC in 3-4 weeks if no improvement. Consider imaging and referral to physical therapy.   Marco CollieWhitney Taraann Olthoff, PA-C  [facility] at [facility] 09/06/2016 1:44 PM

## 2016-09-06 NOTE — Patient Instructions (Addendum)
Flexeril - is a muscle relaxer. Take this as described. If you feel drowsy with this, take it at nighttime.   Meloxicam - Take 2 pills if one does not work for you - you may take them at the same time, or space them out over a 24 hour period. Do not take ibuprofen while taking this medication.  These medications do not interact. You may take them together.   Please stay well hydrated. Drink at least 2 liters of water a day, or enough to keep your urine pale yellow.  See the below exercises to help your back pain.  You may use a heating pad and/or ice as needed for pain. Use this 3-4 times a day for 20-30 minutes at a time.  Come back in 3--4 weeks if you are not better.    Back Exercises The following exercises strengthen the muscles that help to support the back. They also help to keep the lower back flexible. Doing these exercises can help to prevent back pain or lessen existing pain. If you have back pain or discomfort, try doing these exercises 2-3 times each day or as told by your health care provider. When the pain goes away, do them once each day, but increase the number of times that you repeat the steps for each exercise (do more repetitions). If you do not have back pain or discomfort, do these exercises once each day or as told by your health care provider. Exercises Single Knee to Chest  Repeat these steps 3-5 times for each leg: 1. Lie on your back on a firm bed or the floor with your legs extended. 2. Bring one knee to your chest. Your other leg should stay extended and in contact with the floor. 3. Hold your knee in place by grabbing your knee or thigh. 4. Pull on your knee until you feel a gentle stretch in your lower back. 5. Hold the stretch for 10-30 seconds. 6. Slowly release and straighten your leg. Pelvic Tilt  Repeat these steps 5-10 times: 1. Lie on your back on a firm bed or the floor with your legs extended. 2. Bend your knees so they are pointing toward the ceiling  and your feet are flat on the floor. 3. Tighten your lower abdominal muscles to press your lower back against the floor. This motion will tilt your pelvis so your tailbone points up toward the ceiling instead of pointing to your feet or the floor. 4. With gentle tension and even breathing, hold this position for 5-10 seconds. Cat-Cow  Repeat these steps until your lower back becomes more flexible: 1. Get into a hands-and-knees position on a firm surface. Keep your hands under your shoulders, and keep your knees under your hips. You may place padding under your knees for comfort. 2. Let your head hang down, and point your tailbone toward the floor so your lower back becomes rounded like the back of a cat. 3. Hold this position for 5 seconds. 4. Slowly lift your head and point your tailbone up toward the ceiling so your back forms a sagging arch like the back of a cow. 5. Hold this position for 5 seconds. Press-Ups  Repeat these steps 5-10 times: 1. Lie on your abdomen (face-down) on the floor. 2. Place your palms near your head, about shoulder-width apart. 3. While you keep your back as relaxed as possible and keep your hips on the floor, slowly straighten your arms to raise the top half of your body  and lift your shoulders. Do not use your back muscles to raise your upper torso. You may adjust the placement of your hands to make yourself more comfortable. 4. Hold this position for 5 seconds while you keep your back relaxed. 5. Slowly return to lying flat on the floor. Bridges  Repeat these steps 10 times: 1. Lie on your back on a firm surface. 2. Bend your knees so they are pointing toward the ceiling and your feet are flat on the floor. 3. Tighten your buttocks muscles and lift your buttocks off of the floor until your waist is at almost the same height as your knees. You should feel the muscles working in your buttocks and the back of your thighs. If you do not feel these muscles, slide your  feet 1-2 inches farther away from your buttocks. 4. Hold this position for 3-5 seconds. 5. Slowly lower your hips to the starting position, and allow your buttocks muscles to relax completely. If this exercise is too easy, try doing it with your arms crossed over your chest. Abdominal Crunches  Repeat these steps 5-10 times: 1. Lie on your back on a firm bed or the floor with your legs extended. 2. Bend your knees so they are pointing toward the ceiling and your feet are flat on the floor. 3. Cross your arms over your chest. 4. Tip your chin slightly toward your chest without bending your neck. 5. Tighten your abdominal muscles and slowly raise your trunk (torso) high enough to lift your shoulder blades a tiny bit off of the floor. Avoid raising your torso higher than that, because it can put too much stress on your low back and it does not help to strengthen your abdominal muscles. 6. Slowly return to your starting position. Back Lifts  Repeat these steps 5-10 times: 1. Lie on your abdomen (face-down) with your arms at your sides, and rest your forehead on the floor. 2. Tighten the muscles in your legs and your buttocks. 3. Slowly lift your chest off of the floor while you keep your hips pressed to the floor. Keep the back of your head in line with the curve in your back. Your eyes should be looking at the floor. 4. Hold this position for 3-5 seconds. 5. Slowly return to your starting position. Contact a health care provider if:  Your back pain or discomfort gets much worse when you do an exercise.  Your back pain or discomfort does not lessen within 2 hours after you exercise. If you have any of these problems, stop doing these exercises right away. Do not do them again unless your health care provider says that you can. Get help right away if:  You develop sudden, severe back pain. If this happens, stop doing the exercises right away. Do not do them again unless your health care  provider says that you can. This information is not intended to replace advice given to you by your health care provider. Make sure you discuss any questions you have with your health care provider. Document Released: 08/23/2004 Document Revised: 11/23/2015 Document Reviewed: 09/09/2014 Elsevier Interactive Patient Education  2017 [place] you for coming in today. I hope you feel we met your needs.  Feel free to call [facility] if you have any questions or further requests.  Please consider signing up for MyChart if you do not already have it, as this is a great way to communicate with me.  Best,  [employer], PA-C  IF you received labwork today, you will receive an invoice from [facility]. Please contact [facility] at [phone] with questions or concerns regarding your invoice.   Our billing staff will not be able to assist you with questions regarding bills from these companies.  You will be contacted with the lab results as soon as they are available. The fastest way to get your results is to activate your My Chart account. Instructions are located on the last page of this paperwork. If you have not heard from [place] regarding the results in 2 weeks, please contact this office.

## 2016-09-08 LAB — URINE CULTURE: Organism ID, Bacteria: NO GROWTH

## 2019-05-18 ENCOUNTER — Ambulatory Visit (HOSPITAL_COMMUNITY)
Admission: EM | Admit: 2019-05-18 | Discharge: 2019-05-18 | Disposition: A | Discharge status: Home / Self Care | Payer: 59 | Attending: Family Medicine | Admitting: Family Medicine

## 2019-05-18 ENCOUNTER — Encounter (HOSPITAL_COMMUNITY): Payer: Self-pay | Admitting: Emergency Medicine

## 2019-05-18 ENCOUNTER — Other Ambulatory Visit: Payer: Self-pay

## 2019-05-18 DIAGNOSIS — M79605 Pain in left leg: Secondary | ICD-10-CM

## 2019-05-18 NOTE — ED Provider Notes (Signed)
[facility]    CSN: [id number] Arrival date & time: 05/18/19  1729      History   Chief Complaint Chief Complaint  Patient presents with  . Leg Pain    HPI Aimee LukesCynthia Moore is a 48 y.o. female.   HPI  Patient noticed some pain in her left calf yesterday.  Is worse today.  She is worried about having a blood clot. She has not had any accident or injury.  No recent travel.  No change in medications.  She has been on norethindrone for 4 to 5 years for regulation of her menstrual periods.  She does not smoke.  She has never had DVT or blood clot.  They do not run in her family. She does have problems with his left knee at times.  Gets sore and swells. She took her Mobic last night went to bed.  When she got this morning it was worse.  She is here for medical evaluation. She has well-controlled diabetes.  Otherwise in good health  Past Medical History:  Diagnosis Date  . Abnormal uterine bleeding   . Arthritis    Left knee  . Diabetes mellitus     Patient Active Problem List   Diagnosis Date Noted  . DM type 2 causing complication (HCC) 10/19/2011  . Obesity (BMI 35.0-39.9 without comorbidity) 10/19/2011  . Confusion 10/18/2011    Past Surgical History:  Procedure Laterality Date  . DILATION AND CURETTAGE OF UTERUS  08/23/2008   papillary synctial metaplasia  . HYSTEROSCOPY W/D&C N/A 07/04/2015   Procedure: DILATATION AND CURETTAGE /HYSTEROSCOPY;  Surgeon: Jerene BearsMary S Miller, MD;  Location: [facility];  Service: Gynecology;  Laterality: N/A;    OB History    Gravida  0   Para  0   Term      Preterm      AB      Living        SAB      TAB      Ectopic      Multiple      Live Births               Home Medications    Prior to Admission medications   Medication Sig Start Date End Date Taking? Authorizing Provider  Ascorbic Acid (VITAMIN C PO) Take 2 capsules by mouth daily.    Yes [provider]  Cholecalciferol (VITAMIN D3) 5000  UNITS CAPS Take 1 capsule by mouth daily.   Yes [provider]  meloxicam (MOBIC) 15 MG tablet Take 1 tablet by mouth daily as needed for pain.  05/26/15  Yes [provider]  norethindrone (AYGESTIN) 5 MG tablet Take 1 tablet (5 mg total) by mouth daily. 07/07/15  Yes Jerene BearsMiller, Mary S, MD  Turmeric (QC TUMERIC COMPLEX PO) Take by mouth.   Yes [provider]  NON FORMULARY Take 1 capsule by mouth 2 (two) times daily. R-lipoic acid    [provider]    Family History Family History  Problem Relation Age of Onset  . Hypertension Mother   . Lupus Mother   . Diabetes Father   . Hypertension Sister   . Cancer Maternal Grandfather        prostate  . Diabetes Paternal Grandmother   . Cancer Maternal Aunt        ovarian or uterine?  . Diabetes Paternal Aunt   . Diabetes Paternal Uncle     Social History Social History  Tobacco Use  . Smoking status: Never Smoker  . Smokeless tobacco: Never Used  Substance Use Topics  . Alcohol use: No  . Drug use: No     Allergies   Patient has no known allergies.   Review of Systems Review of Systems  Constitutional: Negative for chills and fever.  HENT: Negative for ear pain and sore throat.   Eyes: Negative for pain and visual disturbance.  Respiratory: Negative for cough and shortness of breath.   Cardiovascular: Negative for chest pain and palpitations.  Gastrointestinal: Negative for abdominal pain and vomiting.  Genitourinary: Negative for dysuria and hematuria.  Musculoskeletal: Positive for arthralgias and gait problem. Negative for back pain.  Skin: Negative for color change and rash.  Neurological: Negative for seizures and syncope.  All other systems reviewed and are negative.    Physical Exam Triage Vital Signs ED Triage Vitals  Enc Vitals Group     BP 05/18/19 1839 121/70     Pulse Rate 05/18/19 1839 81     Resp 05/18/19 1839 (!) 22     Temp 05/18/19 1839 98.5 F (36.9 C)      Temp Source 05/18/19 1839 Oral     SpO2 05/18/19 1839 97 %     Weight --      Height --      Head Circumference --      Peak Flow --      Pain Score 05/18/19 1832 7     Pain Loc --      Pain Edu? --      Excl. in [place]? --    No data found.  Updated Vital Signs BP 121/70 (BP Location: Left Arm) Comment (BP Location): large cuff  Pulse 81   Temp 98.5 F (36.9 C) (Oral)   Resp (!) 22   SpO2 97%   Physical Exam Constitutional:      General: She is not in acute distress.    Appearance: She is well-developed.  HENT:     Head: Normocephalic and atraumatic.  Eyes:     Conjunctiva/sclera: Conjunctivae normal.     Pupils: Pupils are equal, round, and reactive to light.  Neck:     Musculoskeletal: Normal range of motion.  Cardiovascular:     Rate and Rhythm: Normal rate.  Pulmonary:     Effort: Pulmonary effort is normal. No respiratory distress.  Abdominal:     General: There is no distension.     Palpations: Abdomen is soft.  Musculoskeletal: Normal range of motion.       Legs:     Comments: Calf circumference at 6 cm below the tibial tuberosity is 40 on the right and 39 on the left  Calf circumference at 12 cm below tibial tuberosity is 29 on the right and 28 on the left  Skin:    General: Skin is warm and dry.  Neurological:     General: No focal deficit present.     Mental Status: She is alert.     Motor: No weakness.     Coordination: Coordination normal.     Gait: Gait abnormal.     Deep Tendon Reflexes: Reflexes normal.  Psychiatric:        Mood and Affect: Mood normal.        Behavior: Behavior normal.      UC Treatments / Results  Labs (all labs ordered are listed, but only abnormal results are displayed) Labs Reviewed - No data to display  EKG  Radiology No results found.  Procedures Procedures (including critical care time)  Medications Ordered in UC Medications - No data to display  Initial Impression / Assessment and Plan / UC Course  I  have reviewed the triage vital signs and the nursing notes.  Pertinent labs & imaging results that were available during my care of the patient were reviewed by me and considered in my medical decision making (see chart for details).     Patient is worried about blood clot.  She does not have any tenderness in the deep calf.  She does not have any pain with Homans testing.  She has decreased circumference of the left calf as opposed to the right.  She has tenderness is adjacent to her known knee osteoarthritis.  I believe she has more of a musculoskeletal pain and NOT a DVT. Final Clinical Impressions(s) / UC Diagnoses   Final diagnoses:  Leg pain, left     Discharge Instructions     Warm compresses to area Limit walking while leg is painful Continue meloxicam 15 mg a day In addition take Tylenol if needed for pain Expect improvement in a couple days Your PCP if not improving by the end of the week   ED Prescriptions    None     PDMP not reviewed this encounter.   Eustace Moore, MD 05/18/19 [phone]

## 2019-05-18 NOTE — Discharge Instructions (Signed)
Warm compresses to area Limit walking while leg is painful Continue meloxicam 15 mg a day In addition take Tylenol if needed for pain Expect improvement in a couple days Your PCP if not improving by the end of the week

## 2019-05-18 NOTE — ED Notes (Signed)
Patient pulled up both pant legs for provider.  Recommended patient put on a gown, not wanting to do this

## 2019-05-18 NOTE — ED Triage Notes (Signed)
Noticed minor pain yesterday in left lower leg.  Took meloxicam.  This morning pain in left calf and lateral lower leg is significant.  Denies injury

## 2020-11-30 ENCOUNTER — Other Ambulatory Visit: Payer: Self-pay | Admitting: Internal Medicine

## 2020-11-30 DIAGNOSIS — Z1231 Encounter for screening mammogram for malignant neoplasm of breast: Secondary | ICD-10-CM

## 2021-01-20 ENCOUNTER — Ambulatory Visit: Payer: 59

## 2022-03-05 ENCOUNTER — Ambulatory Visit (HOSPITAL_COMMUNITY)
Admission: EM | Admit: 2022-03-05 | Discharge: 2022-03-05 | Disposition: A | Discharge status: Home / Self Care | Payer: No Typology Code available for payment source

## 2022-03-05 ENCOUNTER — Encounter (HOSPITAL_COMMUNITY): Payer: Self-pay

## 2022-03-05 DIAGNOSIS — J069 Acute upper respiratory infection, unspecified: Secondary | ICD-10-CM | POA: Diagnosis not present

## 2022-03-05 NOTE — Discharge Instructions (Signed)
Take Zyrtec and flonase as discussed

## 2022-03-05 NOTE — ED Triage Notes (Signed)
Pt is here for headache  with pressure, cough. Pt denies , nausea,  dizziness loss of tatse  and smell

## 2022-03-05 NOTE — ED Provider Notes (Signed)
[facility]    CSN: [id number] Arrival date & time: 03/05/22  1814      History   Chief Complaint Chief Complaint  Patient presents with   Cough   Headache    Pt has a headache, cough xfew days    HPI Aimee Moore is a 51 y.o. female.   Patient here concerned with cough x 1 day.  She states she had sore throat 2 days ago, now cough, nasal congestion, rhinorrhea.  Cough is semi productive.  Denies f/c, n/v/d, abdominal pain, wheezing, SOB.    Past Medical History:  Diagnosis Date   Abnormal uterine bleeding    Arthritis    Left knee   Diabetes mellitus     Patient Active Problem List   Diagnosis Date Noted   DM type 2 causing complication (HCC) 10/19/2011   Obesity (BMI 35.0-39.9 without comorbidity) 10/19/2011   Confusion 10/18/2011    Past Surgical History:  Procedure Laterality Date   DILATION AND CURETTAGE OF UTERUS  08/23/2008   papillary synctial metaplasia   HYSTEROSCOPY WITH D & C N/A 07/04/2015   Procedure: DILATATION AND CURETTAGE /HYSTEROSCOPY;  Surgeon: Jerene Bears, MD;  Location: [facility];  Service: Gynecology;  Laterality: N/A;    OB History     Gravida  0   Para  0   Term      Preterm      AB      Living         SAB      IAB      Ectopic      Multiple      Live Births               Home Medications    Prior to Admission medications   Medication Sig Start Date End Date Taking? Authorizing Provider  Ascorbic Acid (VITAMIN C PO) Take 2 capsules by mouth daily.     [provider]  Cholecalciferol (VITAMIN D3) 5000 UNITS CAPS Take 1 capsule by mouth daily.    [provider]  meloxicam (MOBIC) 15 MG tablet Take 1 tablet by mouth daily as needed for pain.  05/26/15   [provider]  NON FORMULARY Take 1 capsule by mouth 2 (two) times daily. R-lipoic acid    [provider]  norethindrone (AYGESTIN) 5 MG tablet Take 1 tablet (5 mg total) by mouth daily. 07/07/15   Jerene Bears, MD  Turmeric (QC TUMERIC COMPLEX PO) Take by mouth.    [provider]    Family History Family History  Problem Relation Age of Onset   Hypertension Mother    Lupus Mother    Diabetes Father    Hypertension Sister    Cancer Maternal Grandfather        prostate   Diabetes Paternal Grandmother    Cancer Maternal Aunt        ovarian or uterine?   Diabetes Paternal Aunt    Diabetes Paternal Uncle     Social History Social History   Tobacco Use   Smoking status: Never   Smokeless tobacco: Never  Substance Use Topics   Alcohol use: No   Drug use: No     Allergies   Patient has no known allergies.   Review of Systems Review of Systems  Constitutional:  Negative for chills, fatigue and fever.  HENT:  Positive for congestion, rhinorrhea and sore throat. Negative for ear pain, nosebleeds, postnasal drip, sinus pressure  and sinus pain.   Eyes:  Negative for pain and redness.  Respiratory:  Positive for cough. Negative for shortness of breath and wheezing.   Gastrointestinal:  Negative for abdominal pain, diarrhea, nausea and vomiting.  Musculoskeletal:  Negative for arthralgias and myalgias.  Skin:  Negative for rash.  Neurological:  Positive for headaches. Negative for light-headedness.  Hematological:  Negative for adenopathy. Does not bruise/bleed easily.  Psychiatric/Behavioral:  Negative for confusion and sleep disturbance.      Physical Exam Triage Vital Signs ED Triage Vitals  Enc Vitals Group     BP 03/05/22 1846 (!) 148/85     Pulse Rate 03/05/22 1846 93     Resp 03/05/22 1846 12     Temp 03/05/22 1846 100 F (37.8 C)     Temp Source 03/05/22 1846 Oral     SpO2 03/05/22 1846 96 %     Weight 03/05/22 1851 (!) 321 lb (145.6 kg)     Height 03/05/22 1851 5\' 3"  (1.6 m)     Head Circumference --      Peak Flow --      Pain Score 03/05/22 1850 7     Pain Loc --      Pain Edu? --      Excl. in [place]? --    No data found.  Updated Vital  Signs BP (!) 148/85 (BP Location: Left Arm)   Pulse 93   Temp 100 F (37.8 C) (Oral)   Resp 12   Ht 5\' 3"  (1.6 m)   Wt (!) 321 lb (145.6 kg)   SpO2 96%   BMI 56.86 kg/m   Visual Acuity Right Eye Distance:   Left Eye Distance:   Bilateral Distance:    Right Eye Near:   Left Eye Near:    Bilateral Near:     Physical Exam Vitals and nursing note reviewed.  Constitutional:      General: She is not in acute distress.    Appearance: Normal appearance. She is not ill-appearing.  HENT:     Head: Normocephalic and atraumatic.     Right Ear: Tympanic membrane and ear canal normal.     Left Ear: Tympanic membrane and ear canal normal.     Nose: No congestion or rhinorrhea.     Mouth/Throat:     Pharynx: No oropharyngeal exudate or posterior oropharyngeal erythema.  Eyes:     General: No scleral icterus.    Extraocular Movements: Extraocular movements intact.     Conjunctiva/sclera: Conjunctivae normal.  Cardiovascular:     Rate and Rhythm: Normal rate and regular rhythm.     Heart sounds: No murmur heard. Pulmonary:     Effort: Pulmonary effort is normal. No respiratory distress.     Breath sounds: Normal breath sounds. No wheezing or rales.  Musculoskeletal:     Cervical back: Normal range of motion. No rigidity.  Skin:    Coloration: Skin is not jaundiced.     Findings: No rash.  Neurological:     General: No focal deficit present.     Mental Status: She is alert and oriented to person, place, and time.     Motor: No weakness.     Gait: Gait normal.  Psychiatric:        Mood and Affect: Mood normal.        Behavior: Behavior normal.      UC Treatments / Results  Labs (all labs ordered are listed, but only abnormal results are displayed)  Labs Reviewed - No data to display  EKG   Radiology No results found.  Procedures Procedures (including critical care time)  Medications Ordered in UC Medications - No data to display  Initial Impression / Assessment  and Plan / UC Course  I have reviewed the triage vital signs and the nursing notes.  Pertinent labs & imaging results that were available during my care of the patient were reviewed by me and considered in my medical decision making (see chart for details).     Take OTC cough/cold mediation as discussed Follow up PRN, 1 week Final Clinical Impressions(s) / UC Diagnoses   Final diagnoses:  Viral upper respiratory tract infection     Discharge Instructions      Take Zyrtec and flonase as discussed    ED Prescriptions   None    PDMP not reviewed this encounter.   Evern Core, PA-C 03/05/22 1919

## 2022-09-16 ENCOUNTER — Ambulatory Visit
Admission: EM | Admit: 2022-09-16 | Discharge: 2022-09-16 | Disposition: A | Discharge status: Home / Self Care | Payer: No Typology Code available for payment source | Attending: Urgent Care | Admitting: Urgent Care

## 2022-09-16 DIAGNOSIS — E119 Type 2 diabetes mellitus without complications: Secondary | ICD-10-CM | POA: Insufficient documentation

## 2022-09-16 DIAGNOSIS — B349 Viral infection, unspecified: Secondary | ICD-10-CM

## 2022-09-16 DIAGNOSIS — U071 COVID-19: Secondary | ICD-10-CM | POA: Diagnosis not present

## 2022-09-16 DIAGNOSIS — R0989 Other specified symptoms and signs involving the circulatory and respiratory systems: Secondary | ICD-10-CM | POA: Diagnosis not present

## 2022-09-16 MED ORDER — CETIRIZINE HCL 10 MG PO TABS: 10.0000 mg | ORAL_TABLET | Freq: Every day | ORAL | 0 refills | Status: DC

## 2022-09-16 MED ORDER — PREDNISONE 10 MG PO TABS: 30.0000 mg | ORAL_TABLET | Freq: Every day | ORAL | 0 refills | Status: DC

## 2022-09-16 MED ORDER — PROMETHAZINE-DM 6.25-15 MG/5ML PO SYRP: 5.0000 mL | ORAL_SOLUTION | Freq: Three times a day (TID) | ORAL | 0 refills | Status: DC | PRN

## 2022-09-16 NOTE — Discharge Instructions (Addendum)
We will notify you of your test results as they arrive and may take between about 24 hours.  I encourage you to sign up for MyChart if you have not already done so as this can be the easiest way for [place] to communicate results to you online or through a phone app.  Generally, we only contact you if it is a positive test result.  In the meantime, if you develop worsening symptoms including fever, chest pain, shortness of breath despite our current treatment plan then please report to the emergency room as this may be a sign of worsening status from possible viral infection.  Otherwise, we will manage this as a viral syndrome. For sore throat or cough try using a honey-based tea. Use 3 teaspoons of honey with juice squeezed from half lemon. Place shaved pieces of ginger into 1/2-1 cup of water and warm over stove top. Then mix the ingredients and repeat every 4 hours as needed. Please take Tylenol 574m-650mg every 6 hours for aches and pains, fevers. Hydrate very well with at least 2 liters of water. Eat light meals such as soups to replenish electrolytes and soft fruits, veggies. Start an antihistamine like Zyrtec (143mdaily) for postnasal drainage, sinus congestion.  You can take this together with predniosone.  Use the cough medications as needed.

## 2022-09-16 NOTE — ED Triage Notes (Signed)
Pt c/o prod cough, head/chest congestion day 4-NAD-steady gait

## 2022-09-16 NOTE — ED Provider Notes (Signed)
[facility]  Note:  This document was prepared using Systems analyst and may include unintentional dictation errors.  MRN: [id number] DOB: [birth year]  Subjective:   Aimee Moore is a 52 y.o. female presenting for 4-day history of acute onset productive cough, chest congestion, sinus congestion, sinus drainage, bilateral ear fullness and pain.  No shortness of breath or wheezing.  No history of asthma.  Patient is a well-controlled diabetic, treated with metformin, no insulin use.  No smoking.  No history of CKD.  No current facility-administered medications for this encounter.  Current Outpatient Medications:    Ascorbic Acid (VITAMIN C PO), Take 2 capsules by mouth daily. , Disp: , Rfl:    Cholecalciferol (VITAMIN D3) 5000 UNITS CAPS, Take 1 capsule by mouth daily., Disp: , Rfl:    meloxicam (MOBIC) 15 MG tablet, Take 1 tablet by mouth daily as needed for pain. , Disp: , Rfl: 2   NON FORMULARY, Take 1 capsule by mouth 2 (two) times daily. R-lipoic acid, Disp: , Rfl:    norethindrone (AYGESTIN) 5 MG tablet, Take 1 tablet (5 mg total) by mouth daily., Disp: 30 tablet, Rfl: 13   Turmeric (QC TUMERIC COMPLEX PO), Take by mouth., Disp: , Rfl:    No Known Allergies  Past Medical History:  Diagnosis Date   Abnormal uterine bleeding    Arthritis    Left knee   Diabetes mellitus      Past Surgical History:  Procedure Laterality Date   DILATION AND CURETTAGE OF UTERUS  08/23/2008   papillary synctial metaplasia   HYSTEROSCOPY WITH D & C N/A 07/04/2015   Procedure: DILATATION AND CURETTAGE /HYSTEROSCOPY;  Surgeon: Megan Salon, MD;  Location: [facility];  Service: Gynecology;  Laterality: N/A;    Family History  Problem Relation Age of Onset   Hypertension Mother    Lupus Mother    Diabetes Father    Hypertension Sister    Cancer Maternal Grandfather        prostate   Diabetes Paternal Grandmother    Cancer Maternal Aunt        ovarian  or uterine?   Diabetes Paternal Aunt    Diabetes Paternal Uncle     Social History   Tobacco Use   Smoking status: Never   Smokeless tobacco: Never  Vaping Use   Vaping Use: Never used  Substance Use Topics   Alcohol use: No   Drug use: No    ROS   Objective:   Vitals: BP (!) 145/91 (BP Location: Right Arm)   Pulse 94   Temp 98.1 F (36.7 C) (Oral)   Resp 20   LMP 08/19/2022   SpO2 95%   Physical Exam Constitutional:      General: She is not in acute distress.    Appearance: Normal appearance. She is well-developed. She is obese. She is not ill-appearing, toxic-appearing or diaphoretic.  HENT:     Head: Normocephalic and atraumatic.     Right Ear: Tympanic membrane, ear canal and external ear normal. No drainage or tenderness. No middle ear effusion. There is no impacted cerumen. Tympanic membrane is not erythematous or bulging.     Left Ear: Tympanic membrane, ear canal and external ear normal. No drainage or tenderness.  No middle ear effusion. There is no impacted cerumen. Tympanic membrane is not erythematous or bulging.     Nose: Congestion present. No rhinorrhea.     Mouth/Throat:     Mouth:  Mucous membranes are moist. No oral lesions.     Pharynx: No pharyngeal swelling, oropharyngeal exudate, posterior oropharyngeal erythema or uvula swelling.     Tonsils: No tonsillar exudate or tonsillar abscesses.     Comments: Significant post-nasal drainage.  Eyes:     General: No scleral icterus.       Right eye: No discharge.        Left eye: No discharge.     Extraocular Movements: Extraocular movements intact.     Right eye: Normal extraocular motion.     Left eye: Normal extraocular motion.     Conjunctiva/sclera: Conjunctivae normal.  Cardiovascular:     Rate and Rhythm: Normal rate and regular rhythm.     Heart sounds: Normal heart sounds. No murmur heard.    No friction rub. No gallop.  Pulmonary:     Effort: Pulmonary effort is normal. No respiratory  distress.     Breath sounds: No stridor. No wheezing, rhonchi or rales.  Chest:     Chest wall: No tenderness.  Musculoskeletal:     Cervical back: Normal range of motion and neck supple.  Lymphadenopathy:     Cervical: No cervical adenopathy.  Skin:    General: Skin is warm and dry.  Neurological:     General: No focal deficit present.     Mental Status: She is alert and oriented to person, place, and time.  Psychiatric:        Mood and Affect: Mood normal.        Behavior: Behavior normal.     Assessment and Plan :   PDMP not reviewed this encounter.  1. Acute viral syndrome   2. Chest congestion   3. Type 2 diabetes mellitus treated without insulin ([place])     Deferred imaging given clear cardiopulmonary exam, hemodynamically stable vital signs. Will manage for viral illness such as viral URI, viral syndrome, viral rhinitis, COVID-19.  Given severity of her symptoms, offered an oral prednisone course.  Patient has responded to this in the past when she has had bronchitis.  Recommended supportive care. Offered scripts for symptomatic relief. Testing is pending. Counseled patient on potential for adverse effects with medications prescribed/recommended today, ER and return-to-clinic precautions discussed, patient verbalized understanding.   Patient should undergo Paxlovid treatment should she test positive for COVID-19, results pending for tomorrow.   Jaynee Eagles, [place] 09/16/22 1354

## 2022-09-17 LAB — SARS CORONAVIRUS 2 (TAT 6-24 HRS): SARS Coronavirus 2: POSITIVE — AB

## 2023-05-17 ENCOUNTER — Ambulatory Visit: Payer: No Typology Code available for payment source | Admitting: Dietician

## 2023-07-19 ENCOUNTER — Ambulatory Visit: Payer: No Typology Code available for payment source | Admitting: Dietician

## 2023-10-10 ENCOUNTER — Ambulatory Visit: Payer: No Typology Code available for payment source | Admitting: Dermatology

## 2023-10-11 ENCOUNTER — Ambulatory Visit
Admission: EM | Admit: 2023-10-11 | Discharge: 2023-10-11 | Disposition: A | Discharge status: Home / Self Care | Attending: Family Medicine | Admitting: Family Medicine

## 2023-10-11 ENCOUNTER — Ambulatory Visit (INDEPENDENT_AMBULATORY_CARE_PROVIDER_SITE_OTHER)

## 2023-10-11 DIAGNOSIS — B9689 Other specified bacterial agents as the cause of diseases classified elsewhere: Secondary | ICD-10-CM

## 2023-10-11 DIAGNOSIS — J208 Acute bronchitis due to other specified organisms: Secondary | ICD-10-CM | POA: Diagnosis not present

## 2023-10-11 DIAGNOSIS — R0989 Other specified symptoms and signs involving the circulatory and respiratory systems: Secondary | ICD-10-CM | POA: Diagnosis not present

## 2023-10-11 LAB — POC COVID19/FLU A&B COMBO
Covid Antigen, POC: NEGATIVE
Influenza A Antigen, POC: NEGATIVE
Influenza B Antigen, POC: NEGATIVE

## 2023-10-11 LAB — POCT FASTING CBG KUC MANUAL ENTRY: POCT Glucose (KUC): 251 mg/dL — AB (ref 70–99)

## 2023-10-11 MED ORDER — PROMETHAZINE-DM 6.25-15 MG/5ML PO SYRP: 5.0000 mL | ORAL_SOLUTION | Freq: Three times a day (TID) | ORAL | 0 refills | Status: AC | PRN

## 2023-10-11 MED ORDER — IBUPROFEN 600 MG PO TABS: 600.0000 mg | ORAL_TABLET | Freq: Four times a day (QID) | ORAL | 0 refills | Status: DC | PRN

## 2023-10-11 MED ORDER — ACETAMINOPHEN 325 MG PO TABS
650.0000 mg | ORAL_TABLET | Freq: Once | ORAL | Status: AC
  Administered 2023-10-11: 650 mg via ORAL

## 2023-10-11 MED ORDER — AZITHROMYCIN 250 MG PO TABS: ORAL_TABLET | ORAL | 0 refills | Status: DC

## 2023-10-11 MED ORDER — ALBUTEROL SULFATE HFA 108 (90 BASE) MCG/ACT IN AERS: 1.0000 | INHALATION_SPRAY | Freq: Four times a day (QID) | RESPIRATORY_TRACT | 0 refills | Status: DC | PRN

## 2023-10-11 NOTE — Discharge Instructions (Addendum)
 We will manage this as a viral syndrome. For sore throat or cough try using a honey-based tea. Use 3 teaspoons of honey with juice squeezed from half lemon. Place shaved pieces of ginger into 1/2-1 cup of water and warm over stove top. Then mix the ingredients and repeat every 4 hours as needed. Please take Tylenol 500mg -650mg  once every 6 hours for fevers, aches and pains. Hydrate very well with at least 80-100 ounces of water. Eat light meals such as soups (chicken and noodles, chicken wild rice, vegetable).  Do not eat any foods that you are allergic to.  Start an antihistamine like Zyrtec (10mg  daily) for postnasal drainage, sinus congestion.  You can take this together with albuterol inhaler. Use cough syrup as needed.

## 2023-10-11 NOTE — ED Provider Notes (Signed)
 [facility]  Note:  This document was prepared using Conservation officer, historic buildings and may include unintentional dictation errors.  MRN: [id number] DOB: 1971-05-30  Subjective:   Aimee Moore is a 53 y.o. female presenting for acute onset since this morning of coughing, congestion, wheezing, chest pressure, headache and ear pain.  No history of asthma.  Patient does have diabetes.  No current facility-administered medications for this encounter.  Current Outpatient Medications:    Ascorbic Acid (VITAMIN C PO), Take 2 capsules by mouth daily. , Disp: , Rfl:    cetirizine (ZYRTEC ALLERGY) 10 MG tablet, Take 1 tablet (10 mg total) by mouth daily., Disp: 30 tablet, Rfl: 0   Cholecalciferol (VITAMIN D3) 5000 UNITS CAPS, Take 1 capsule by mouth daily., Disp: , Rfl:    meloxicam (MOBIC) 15 MG tablet, Take 1 tablet by mouth daily as needed for pain. , Disp: , Rfl: 2   NON FORMULARY, Take 1 capsule by mouth 2 (two) times daily. R-lipoic acid, Disp: , Rfl:    norethindrone (AYGESTIN) 5 MG tablet, Take 1 tablet (5 mg total) by mouth daily., Disp: 30 tablet, Rfl: 13   predniSONE (DELTASONE) 10 MG tablet, Take 3 tablets (30 mg total) by mouth daily with breakfast., Disp: 15 tablet, Rfl: 0   promethazine-dextromethorphan (PROMETHAZINE-DM) 6.25-15 MG/5ML syrup, Take 5 mLs by mouth 3 (three) times daily as needed for cough., Disp: 200 mL, Rfl: 0   Turmeric (QC TUMERIC COMPLEX PO), Take by mouth., Disp: , Rfl:    No Known Allergies  Past Medical History:  Diagnosis Date   Abnormal uterine bleeding    Arthritis    Left knee   Diabetes mellitus      Past Surgical History:  Procedure Laterality Date   DILATION AND CURETTAGE OF UTERUS  08/23/2008   papillary synctial metaplasia   HYSTEROSCOPY WITH D & C N/A 07/04/2015   Procedure: DILATATION AND CURETTAGE /HYSTEROSCOPY;  Surgeon: Jerene Bears, MD;  Location: [facility];  Service: Gynecology;  Laterality: N/A;     Family History  Problem Relation Age of Onset   Hypertension Mother    Lupus Mother    Diabetes Father    Hypertension Sister    Cancer Maternal Grandfather        prostate   Diabetes Paternal Grandmother    Cancer Maternal Aunt        ovarian or uterine?   Diabetes Paternal Aunt    Diabetes Paternal Uncle     Social History   Tobacco Use   Smoking status: Never   Smokeless tobacco: Never  Vaping Use   Vaping status: Never Used  Substance Use Topics   Alcohol use: No   Drug use: No    ROS   Objective:   Vitals: BP (!) 146/79 (BP Location: Left Arm)   Pulse (!) 120   Temp (!) 103 F (39.4 C) (Oral)   Resp 16   LMP 10/06/2023 (Approximate)   SpO2 92%   Physical Exam Constitutional:      General: She is not in acute distress.    Appearance: Normal appearance. She is well-developed. She is obese. She is not ill-appearing, toxic-appearing or diaphoretic.  HENT:     Head: Normocephalic and atraumatic.     Right Ear: Tympanic membrane, ear canal and external ear normal. No drainage or tenderness. No middle ear effusion. There is no impacted cerumen. Tympanic membrane is not erythematous or bulging.     Left Ear:  Tympanic membrane, ear canal and external ear normal. No drainage or tenderness.  No middle ear effusion. There is no impacted cerumen. Tympanic membrane is not erythematous or bulging.     Nose: Nose normal. No congestion or rhinorrhea.     Mouth/Throat:     Mouth: Mucous membranes are moist. No oral lesions.     Pharynx: No pharyngeal swelling, oropharyngeal exudate, posterior oropharyngeal erythema or uvula swelling.     Tonsils: No tonsillar exudate or tonsillar abscesses.  Eyes:     General: No scleral icterus.       Right eye: No discharge.        Left eye: No discharge.     Extraocular Movements: Extraocular movements intact.     Right eye: Normal extraocular motion.     Left eye: Normal extraocular motion.     Conjunctiva/sclera:  Conjunctivae normal.  Cardiovascular:     Rate and Rhythm: Normal rate and regular rhythm.     Heart sounds: Normal heart sounds. No murmur heard.    No friction rub. No gallop.  Pulmonary:     Effort: Pulmonary effort is normal. No respiratory distress.     Breath sounds: No stridor. No wheezing, rhonchi or rales.  Chest:     Chest wall: No tenderness.  Musculoskeletal:     Cervical back: Normal range of motion and neck supple.  Lymphadenopathy:     Cervical: No cervical adenopathy.  Skin:    General: Skin is warm and dry.  Neurological:     General: No focal deficit present.     Mental Status: She is alert and oriented to person, place, and time.  Psychiatric:        Mood and Affect: Mood normal.        Behavior: Behavior normal.     Results for orders placed or performed during the hospital encounter of 10/11/23 (from the past 24 hours)  POCT CBG (manual entry)     Status: Abnormal   Collection Time: 10/11/23  1:12 PM  Result Value Ref Range   POCT Glucose (K[facility]) 251 (A) 70 - 99 mg/dL  POC [id number]/AVW A&B Antigen     Status: None   Collection Time: 10/11/23  1:27 PM  Result Value Ref Range   Influenza A Antigen, POC Negative Negative   Influenza B Antigen, POC Negative Negative   Covid Antigen, POC Negative Negative   DG Chest 2 View Result Date: 10/11/2023 CLINICAL DATA:  Cough, chest congestion EXAM: CHEST - 2 VIEW COMPARISON:  10/17/2011 FINDINGS: The heart size and mediastinal contours are within normal limits. Both lungs are clear. The visualized skeletal structures are unremarkable. IMPRESSION: No active cardiopulmonary disease. Electronically Signed   By: Sharlet Salina M.D.   On: 10/11/2023 15:18     Assessment and Plan :   PDMP not reviewed this encounter.  1. Acute bacterial bronchitis   2. Chest congestion    Will cover for acute bacterial bronchitis with azithromycin, supportive care including albuterol, ibuprofen and cough syrup.  Counseled patient on  potential for adverse effects with medications prescribed/recommended today, ER and return-to-clinic precautions discussed, patient verbalized understanding.    Wallis Bamberg, [place] 10/11/23 1910

## 2023-10-11 NOTE — ED Triage Notes (Signed)
 Pt states cough,congestion,wheezing,headache and ear pain since this morning.

## 2023-12-09 ENCOUNTER — Ambulatory Visit: Admitting: Dermatology

## 2023-12-09 ENCOUNTER — Encounter: Payer: Self-pay | Admitting: Dermatology

## 2023-12-09 VITALS — BP 178/118

## 2023-12-09 DIAGNOSIS — L910 Hypertrophic scar: Secondary | ICD-10-CM | POA: Diagnosis not present

## 2023-12-09 DIAGNOSIS — D492 Neoplasm of unspecified behavior of bone, soft tissue, and skin: Secondary | ICD-10-CM | POA: Diagnosis not present

## 2023-12-09 DIAGNOSIS — D485 Neoplasm of uncertain behavior of skin: Secondary | ICD-10-CM

## 2023-12-09 NOTE — Progress Notes (Signed)
   New Patient Visit   Subjective  Aimee Moore is a 53 y.o. female who presents for the following: New Pt - Spot Check  Patient states she has lesion located at the L jaw line that she would like to have examined.She thought the spot was an acne bump at first but she stated "it's hard". Patient reports the areas have been there for 1 year. She reports the areas are bothersome. Patient rates irritation 5 out of 10. No injury/bug bite to the area that she is aware of. She states that the areas have spread, increasing in size. Patient reports she has not previously been treated for these areas. Patient denied Hx of bx. Patient denied family history of skin cancer(s).   The following portions of the chart were reviewed this encounter and updated as appropriate: medications, allergies, medical history  Review of Systems:  No other skin or systemic complaints except as noted in HPI or Assessment and Plan.  Objective  Well appearing patient in no apparent distress; mood and affect are within normal limits.   A focused examination was performed of the following areas: L jaw line   Relevant exam findings are noted in the Assessment and Plan.  Left Parotid Area 1cm flesh colored nodule   Assessment & Plan    NEOPLASM OF UNCERTAIN BEHAVIOR OF SKIN Left Parotid Area Skin / nail biopsy Type of biopsy: tangential   Informed consent: discussed and consent obtained   Timeout: patient name, date of birth, surgical site, and procedure verified   Procedure prep:  Patient was prepped and draped in usual sterile fashion Prep type:  Isopropyl alcohol Anesthesia: the lesion was anesthetized in a standard fashion   Anesthetic:  1% lidocaine  w/ epinephrine  1-100,000 buffered w/ 8.4% NaHCO3 Instrument used: DermaBlade   Hemostasis achieved with: aluminum chloride   Outcome: patient tolerated procedure well   Post-procedure details: sterile dressing applied and wound care instructions given    Dressing type: petrolatum gauze and bandage    No follow-ups on file.    Documentation: I have reviewed the above documentation for accuracy and completeness, and I agree with the above.  I, Shirron Louanne Roussel, CMA, am acting as scribe for [employer], DO.   Louana Roup, DO

## 2023-12-09 NOTE — Patient Instructions (Addendum)

## 2023-12-12 LAB — SURGICAL PATHOLOGY

## 2023-12-16 ENCOUNTER — Ambulatory Visit: Payer: Self-pay | Admitting: Dermatology
